# Patient Record
Sex: Male | Born: 1955 | Race: White | Hispanic: No | Marital: Married
Health system: Midwestern US, Academic
[De-identification: ages and names within clinical notes are randomized; demographics above are authoritative.]

---

## 2003-12-08 NOTE — Unmapped (Signed)
Signed by Ernesta Amble MD on 12/08/2003 at 00:00:00  Note from Referring Physician      Imported By: Dennie Fetters 01/22/2005 11:37:43    _____________________________________________________________________    External Attachment:    Please see [employer] EMR for this document.

## 2005-01-02 NOTE — Unmapped (Signed)
Signed by Forest Becker on 01/02/2005 at 17:05:42    Phone Note   Call from Patient  Call back at [facility] Phone [phone]  Caller: Patient  Call For: Surgery - Colon Rectal  Reason for Call: Talk to Nurse, Returning call to Nurse    Initial call taken by: Lolita Patella. Sherre Scarlet RMA,  January 02, 2005 4:26 PM    Follow-up for Phone Call   Returned call to patient & left message.  Follow-up for Pre-load.  Follow-up by: Forest Becker,  January 02, 2005 5:05 PM

## 2005-01-03 NOTE — Unmapped (Signed)
Signed by Forest Becker on 01/03/2005 at 10:29:31    General Preload      Preload Clinical Lists   Problems added:   FISSURE, ANAL (ICD-565.0)  DIVERTICULITIS, HX OF (ICD-V12.79)  HEMORRHOIDS (ICD-455.6)      Observations Recorded during this update  Added new observation of SOCIAL HX: He is married He is a smoker.  Packs per day- 1.  He drinks alcoholic beverages occasionally.   (01/03/2005 10:21)  Added new observation of CIGS HX: 1  (01/03/2005 10:21)  Added new observation of SMOK STATUS: smoker  (01/03/2005 10:21)  Added new observation of ALCOHOL USE: occasionally /day (01/03/2005 10:21)  Added new observation of MARITAL STAT: married  (01/03/2005 10:21)  Added new observation of SURG COMM: Removed 12 large intestines d/t diverticulitis '99  (01/03/2005 10:21)  Added new observation of PAST MED HX: Patient indicates medical history of Hx Diverticulitis, Fissure, Hemorrhoids.   (01/03/2005 10:21)  Added new observation of GICOMMENTS: Hx Diverticulitis, Fissure, Hemorrhoids  (01/03/2005 10:21)  Added new observation of NKA: T  (01/03/2005 10:21)  Past History  Past Medical History: Patient indicates medical history of Hx Diverticulitis, Fissure, Hemorrhoids.   Social History: He is married He is a smoker.  Packs per day- 1.  He drinks alcoholic beverages occasionally.                    PRIMARY CARE PHYSICIAN:   Family Physician:   Lyndel Safe, MD                 Address:   [phone]                        City:   [place]:   [place]                          Zip:   [address]                 Phone #:   [phone]                      Fax #:   [phone]  ]

## 2005-01-03 NOTE — Unmapped (Signed)
Signed by Judeen Hammans MD on 01/03/2005 at 00:00:00  Insurance Card      Imported By: Josie Dixon 01/08/2005 09:47:01    _____________________________________________________________________    External Attachment:    Please see [employer] EMR for this document.

## 2005-01-04 NOTE — Unmapped (Signed)
Signed by Judeen Hammans MD on 01/04/2005 at 00:00:00  Colorectal - Health Questionnaire      Imported By: Josie Dixon 01/22/2005 13:42:06    _____________________________________________________________________    External Attachment:    Please see [employer] EMR for this document.

## 2005-01-04 NOTE — Unmapped (Signed)
Signed by Judeen Hammans MD on 01/04/2005 at 16:44:26    Surgery New Patient Visit      Intake - Surgery   Comments New patient visit for 2nd opinion on fissure.  Patient has surgery scheduled in April with Dr. Rosita Kea.    Vital Signs   Height: 71 in.  Weight: 206.6 lbs. BMI (in-lb): 28.92     Temperature: 97.8 degrees  F (oral)  Pulse rate: 60 Respirations: 16      Blood Pressure: Standard  BP #1: 120 / 70mm Hg     Coordinating Care Providers   Primary Care Provider: Coralee North M.D.  Referring Physician: Sofie Rower M.D.  New Medication:  TAVIST ND TABS (LORATADINE TABS) qd  NITROGLYCERIN 0.2 MG/HR PT24 (NITROGLYCERIN) Apply to anus TID  * NEOMYCIN HC SUPPOSITORY 1 PER RECTUM QHS    Intake recorded by: Raynelle Highland Laug  January 04, 2005 3:09 PM      ANORECTAL     History of Present Illness   Chief Complaint: Pain in anus  History From: patient  Date of diagnosis or first concern: 10/25/2004    Basis for Diagnosis:   Clinical: other  Additional Diagnostic Details: Has loose bm's since hemicolectomy 1999.  Pain in anus for 3 mos.      Signs & Symptoms   Patient complains of: pain, bleeding, urgency  Patient denies: pruritus ani, tenesmus, drainage, flatulence, wear a pad-all/night/day, other    Incontinence:   Type: none    Treatment History:   Previous Surgeries: Emergency surgery for diverticulitis.    Medications: .2% nifedipine, metamucil  Previous Colonoscopy: Patient had prior colonoscopy performed by Dr. Thayer Jew at [facility](2000, 2001, 2003, 2005).  Removed polpys.      Past History  Past Medical History (reviewed - no changes required): Patient indicates medical history of Hx Diverticulitis, Fissure, Hemorrhoids.   Surgical History (reviewed - no changes required): Patient reports surgical history to include: Removed 12 large intestines d/t diverticulitis '99.   Family History: Father is deceased. He died of heart failure. Mother is alive. The patient indicates family history of bladder ca  mother,.   Social History (reviewed - no changes required): He is married He is a smoker.  Packs per day- 1.  He drinks alcoholic beverages occasionally.      Review of Systems   General: Denies fevers, chills, sweats, anorexia, fatigue, malaise.   Gastrointestinal: Denies nausea, vomiting, diarrhea, constipation, change in bowel habits, abdominal pain, melena, hematochezia, jaundice, spitting, encopresis, hematemesis, abdominal distention, edema, ascites.       Physical Exam   General Appearance: well-developed, well-nourished and in no acute distress    Gastrointestinal   Rectal: Everstion of anal canal with fissure posterior midline, hypertrophied skin tag.      Assessment    Problem List Updates for today's visit   1. Assessed FISSURE, ANAL (ICD-565.0) as stable  Additional Assessment  Fissure posterior midline.  Scheduled for sphincterotomy.  Would like to exhaust all non surgical options first.  Recommended botox injection.  Discussed possibility that the fissure may not heal and that surgery was definitive with highest risk profile.  BOTOX is less effective but significanlty lower risk profile.    Plan    Medications   TAVIST ND TABS (LORATADINE TABS) qd  NITROGLYCERIN 0.2 MG/HR PT24 (NITROGLYCERIN) Apply to anus TID  * NEOMYCIN HC SUPPOSITORY 1 PER RECTUM QHS    Medication List Updates for today's visit   1. Recorded  NITROGLYCERIN 0.2 MG/HR PT24 Apply to anus TID  2. Recorded * NEOMYCIN HC SUPPOSITORY 1 PER RECTUM QHS    Orders for today's visit  1. Ordered [id number] - Confirmatory consultation, Expanded [[id number]]    Instructions for today's visit  Precert for BOTOX    Additional Plan  BOTOX 50Units next week.      DISPOSITION:    Return to clinic in 1week(s)        Please send my letter and todays office note to the following:   -   Sofie Rower M.D.            ]

## 2005-01-08 NOTE — Unmapped (Signed)
Signed by Quincy Carnes on 01/08/2005 at 14:48:53    Clinical Lists Changes  Per Harriett Sine at [facility] was approved from 01/11/05 through 01/11/06   Approval number: [id number]  Call completed.  Apolinar Junes

## 2005-01-11 NOTE — Unmapped (Signed)
Signed by Judeen Hammans MD on 01/11/2005 at 15:38:52    Surgery Follow-up Visit      Intake - Surgery   Comments pt here for botox injection for anal fissure.    Vital Signs   Height: 71 in. Temperature: 98.6 degrees  F Pulse rate: 56       BP #1: 122 / 80mm Hg     Coordinating Care Providers   Primary Care Provider: Coralee North M.D.  Referring Physician: Sofie Rower M.D.    Intake recorded by: Ernesto Rutherford  January 11, 2005 3:04 PM      ANORECTAL     History of Present Illness   Chief Complaint: pt here for botox injection for anal fissure    Basis for Diagnosis:   Additional Diagnostic Details: NO SIGNIFICANT DIFFERENCE AND SEEN LAST WEEK    Signs & Symptoms   Patient complains of: pain, bleeding  Patient denies: pruritus ani, tenesmus, drainage, urgency, flatulence, wear a pad-all/night/day, other    Past History  Past Medical History (reviewed - no changes required): Patient indicates medical history of Hx Diverticulitis, Fissure, Hemorrhoids.   Surgical History (reviewed - no changes required): Patient reports surgical history to include: Removed 12 large intestines d/t diverticulitis '99.   Family History (reviewed - no changes required): Father is deceased. He died of heart failure. Mother is alive. The patient indicates family history of bladder ca mother,.   Social History (reviewed - no changes required): He is married He is a smoker.  Packs per day- 1.  He drinks alcoholic beverages occasionally.      Review of Systems   General: Denies fevers, chills, sweats, fatigue.   Gastrointestinal: Denies nausea, vomiting, diarrhea, constipation, abdominal pain, hematochezia. rectal pain      Physical Exam     Gastrointestinal   Rectal: Everstion of anal canal with fissure posterior midline, hypertrophied skin tag.      Assessment    Problem List Updates for today's visit   1. Assessed FISSURE, ANAL (ICD-565.0) as stable  Additional Assessment  no significant difference IN SYMPTOMS.  wE DISCUSSED THE  rationale for Botox therapy and the possibility that HE WOULD STILL REQUIRE SURGERY.  Therefore he consented to Botox treatment.  wITH THE PATIENT IN THE KNEE CHEST POSITION the perineum was cleansed with alcohol AND 50 UNITS OF bOTOX WERE INJECTED IN THREE equal aliquots around the FISSURE IN THE POSTERIOR MIDLINE.  The patient tolerated procedure well    Plan    Medications   TAVIST ND TABS (LORATADINE TABS) qd  NITROGLYCERIN 0.2 MG/HR PT24 (NITROGLYCERIN) Apply to anus TID  * NEOMYCIN HC SUPPOSITORY 1 PER RECTUM QHS      Orders for today's visit  1. Ordered 99211 - Ofc Vst, Est Level I [[id number]]  2. Ordered Botulinum Toxin A [J0585]    Instructions for today's visit  I've instructed the patient to call with a progress REPORT'S.  I would expect that he would began to feel some relief in than SEVERAL DAYS.  Atheists had no significant change in symptoms and to three weeks WILL SCHEDULE HIM FOR SPHINCTEROTOMY.        DISPOSITION:    Return to clinic in 6week(s)        Please send my letter and todays office note to the following:   -   Sofie Rower M.D.            ]

## 2005-01-23 NOTE — Unmapped (Signed)
Signed by Neva Seat on 01/23/2005 at 16:03:07          [facility]   [address]  [place], [place] [address]   [phone]  Fax: [phone]               January 04, 2005      Sofie Rower, M.D.   [address]  [place], [place] [address]  Ph: [phone]  Fax: [phone]        RE: [name]   DOB:  12-Apr-1956      Dear Rhoderick Moody,    It was a pleasure seeing your patient, Chris Jefferson, at your request for surgical evaluation. Enclosed please find my office note and recommendations regarding this patient.    I would like to thank you for allowing me to see this patient.  I will await further requests for any future opinions or care of this patient.    Best personal regards.    Sincerely,          Geraldo Pitter. Earlene Plater, M.D. FACS  Assistant Professor Surgery  Division of Colon and Rectal Surgery

## 2005-01-24 NOTE — Unmapped (Signed)
Signed by Neva Seat on 01/24/2005 at 15:17:20          [facility]   [address]  [place], [place] [address]   [phone]  Fax: [phone]           January 11, 2005      Sofie Rower, M.D.   [address]  [place], [place] [address]  Ph: [phone]  Fax: [phone]      RE: RAYBON CONARD   DOB:  [birth year]      Dear Rhoderick Moody,    I recently saw [facility] in the office. Enclosed please find my office note and recommendations regarding this patient.  Should you have any further questions please do not hesitate to contact me at [phone]    I would like to thank you for allowing me to see this patient.       Best personal regards.    Sincerely,          Geraldo Pitter. Earlene Plater, M.D. FACS  Assistant Professor Surgery  Division of Colon and Rectal Surgery

## 2005-01-26 NOTE — Unmapped (Signed)
Signed by Zenaida Deed Yetta Flock MA on 03/06/2005 at 11:58:34    [facility] Surgery Scheduling Form      Surgery  / Procedure Schedule Sheet   Surgeon: Judeen Hammans, M.D.  Anesthesia Type: General  Procedure: Sphincterotomy  Diagnoses: anal fissure      Patient Information   Name: Chris Jefferson  DOB: 17-Sep-1956  SSN: [id number]  Address: [address], [place]  [address]  Gender: Male  Home phone: [phone]  IDX #: [id number]  Last Word #: [id number]    Insurance Information   Form Completed By: Yvonna Alanis  Phone # [phone]  Surgery Scheduled with: Diane    Surgery Notification   Scheduled Date: 02/15/2005  Scheduled Time: 10:30am  Comments: Pt. informed.  Needs EKG per [facility] at [facility].  E[facility] scheduled for 4/12 at [facility].      Patient Insurance Information:    Primary: ANTHEM 1-[place]  ID#: [id number]    Secondary:   ID#:       Primary Care Physician Information:    PCP:

## 2005-02-09 NOTE — Unmapped (Signed)
Signed by Zenaida Deed Yetta Flock MA on 02/09/2005 at 14:46:35    Phone Note   Call from Patient  Call back at Home Phone: [phone]  Caller: Patient  Department: Surgery - Colon Rectal  Call for: davis  Reason for Call: returning call to nurse, talk to nurse    Initial call taken by: Lolita Patella. Sherre Scarlet RMA,  February 09, 2005 1:27 PM    Follow-up for Phone Call   LM for pt.  Follow-up by: Zenaida Deed. Yetta Flock, [place],  February 09, 2005 2:46 PM

## 2005-02-14 NOTE — Unmapped (Signed)
Signed by Judeen Hammans MD on 02/14/2005 at 00:00:00  Pre - Op EKG      Imported By: Josie Dixon 03/14/2005 11:12:44    _____________________________________________________________________    External Attachment:    Please see [facility] EMR for this document.

## 2005-02-15 NOTE — Unmapped (Signed)
Signed by   LinkLog[employer] on 03/09/2005 at 10:33:37  Patient: Chris Jefferson  Note: All result statuses are Final unless otherwise noted.    Tests: (1)  (MR)    Order Note:                                 [facility]     PATIENT NAME:   Chris Jefferson, Chris Jefferson                     MR #:  [id number]  DATE OF BIRTH:  [birth year]                         ACCOUNT #:  [id number]  PHYSICIAN:      Geraldo Pitter. Earlene Plater, M.D.             ROOM #:  SERVICE:        Surgery                            NURSING UNIT:  DICTATED BY:    Geraldo Pitter. Earlene Plater, M.D.             [facility]:  S  PROCEDURE DATE: 02/15/2005                         ADMIT DATE:  02/15/2005                                                     DISCHARGE DATE:                                    OPERATIVE REPORT     ATTENDING SURGEON:  Judeen Hammans, M.D.     ASSISTANT:  None.     PREOPERATIVE DIAGNOSIS:     1.  Chronic anal fissure.     POSTOPERATIVE DIAGNOSIS:     1.  Chronic anal fissure.     PROCEDURE:     1.  Closed partial lateral internal sphincterotomy.     ANESTHESIA:  General.     ESTIMATED BLOOD LOSS:  Minimal.     COMPLICATIONS:  None.     POSTOPERATIVE PROCEDURE CONDITION:  Good.     INDICATIONS:  Mr. Dery is a gentleman who I saw in the office twice with a  chronic posterior midline fissure.  This had been treated with nifedipine  ointment as well as botox.  He failed to heal.  We recommended a  sphincterotomy.  We discussed the risks of the procedure, which would include  bleeding, infection as well as alternatives, which would include continued  medical therapy.  We also discussed the small incidence of fecal incontinence  following sphincterotomy.  The patient demonstrated understanding and elected  to proceed.     DETAILS OF PROCEDURE:  The patient was brought to the operating room, placed  supine on the operating table.  After induction of general endotracheal  anesthesia, he was placed in the lithotomy position.  The perineum was then  prepped and draped  sterilely.  Intersphincteric groove was then identified.  An  11-blade knife was used first to divide the internal sphincter on the  patient's left side.  This was done in the left lateral location.  With two  fingers then used to distract the two edges of the sphincter resulting in a  palpable defect in the lateral aspect of the internal sphincter.  Of note,  the patient had a chronic fissure in the posterior midline.  Also of interest  he had some redundancy of the rectal mucosa.  He had some evidence of  internal intussusception with coughing while under anesthesia.  This was an  incidental finding.  After performing the sphincterotomy hemostasis was  obtained and the area was anesthetized with 0.5% Marcaine to provide  postoperative pain relief.  The patient was then taken to recovery after he  was extubated in the operating room where he was noted to be in good  condition.                                                           _______________________________________  BRD/dla                                _____  D:  02/15/2005 11:27                   Geraldo Pitter. Earlene Plater, M.D.  T:  02/18/2005 12:00  Job #:  [id number]                                       OPERATIVE REPORT                                        COPY                   Page    1 of 1    Note: An exclamation mark (!) indicates a result that was not dispersed into   the flowsheet.  Document Creation Date: 03/09/2005 10:33 AM  _______________________________________________________________________    (1) Order result status: Final  Collection or observation date-time: 02/15/2005 00:00  Requested date-time:   Receipt date-time:   Reported date-time:   Referring Physician:    Ordering Physician:  Reviewed In [facility])  Specimen Source:   Source: DBS  Filler Order Number: [id number] ASC  Lab site:

## 2005-02-22 NOTE — Unmapped (Addendum)
Signed by Judeen Hammans MD on 02/22/2005 at 15:49:41    Colo-Rectal Follow-up Visit      Assessment    Problem List Updates for today's visit   1. Removed FISSURE, ANAL (ICD-565.0). Reason: resolved    Plan    Medications   TAVIST ND TABS (LORATADINE TABS) qd  NITROGLYCERIN 0.2 MG/HR PT24 (NITROGLYCERIN) Apply to anus TID  * NEOMYCIN HC SUPPOSITORY 1 PER RECTUM QHS      Orders for today's visit  1. Ordered Surgical Follow-up (no charge) [[id number]]          DISPOSITION:    Return to clinic prn     THYROID DISEASE     History of Present Illness   Chief Complaint: S/P sphincterotomy    Basis for Diagnosis:   Additional Diagnostic Details: well, itches some    Past History  Past Medical History (reviewed - no changes required): Patient indicates medical history of Hx Diverticulitis, Fissure, Hemorrhoids.   Surgical History (reviewed - no changes required): Patient reports surgical history to include: Removed 12 large intestines d/t diverticulitis '99, Sphincterotomy.   Family History (reviewed - no changes required): Father is deceased. He died of heart failure. Mother is alive. The patient indicates family history of bladder ca mother,.   Social History (reviewed - no changes required): He is married He is a smoker.  Packs per day- 1.  He drinks alcoholic beverages occasionally.      Review of Systems       Intake - Surgery   Comments pt S/P sphincterotomy    Vital Signs   Height: 71 in.  Weight: 203 lbs. Weight changed since last visit: -3.60 lbs.  Temperature: 98.6 degrees  F Pulse rate: 78 Respirations: 12        BP #1: 138 / 86mm Hg     Coordinating Care Providers   Primary Care Provider: Coralee North M.D.  Referring Physician: Sofie Rower M.D.    Intake recorded by: Ernesto Rutherford  February 22, 2005 3:34 PM      Physical Exam     Gastrointestinal   Rectal: healed fissure    cc:  Coralee North M.D.  Sofie Rower M.D.              Signed by Neva Seat on 03/01/2005 at 11:47:53    Faxed to referring  Dr(s).

## 2020-06-09 ENCOUNTER — Ambulatory Visit: Admit: 2020-06-09 | Discharge: 2020-06-09 | Payer: MEDICARE

## 2020-06-09 ENCOUNTER — Inpatient Hospital Stay: Admit: 2020-06-09 | Discharge: 2020-06-13 | Payer: MEDICARE | Attending: Family

## 2020-06-09 ENCOUNTER — Inpatient Hospital Stay: Admit: 2020-06-09 | Payer: MEDICARE | Attending: Family

## 2020-06-09 DIAGNOSIS — M542 Cervicalgia: Secondary | ICD-10-CM

## 2020-06-09 DIAGNOSIS — M5136 Other intervertebral disc degeneration, lumbar region: Secondary | ICD-10-CM

## 2020-06-09 DIAGNOSIS — M545 Low back pain, unspecified: Secondary | ICD-10-CM

## 2020-06-09 NOTE — Unmapped (Signed)
I agree w the H&P as transcribed by the medical student and edited by me, have interviewed and examined the patient, and personally reviewed studies, proposing the treatment plan as outlined.    Has had chrponic LBP  Did PT in past 3y ago, helped.    Takes Tylenol for aches and pains  PRN  Progressive worsening recently  Gets sharp pain w activity  Accompanied by shooting pain down legs at times  Occ weak w PF    PE  PMH, PSH, FH, SH and 10 point ROS is as reviewed on paper-intake scanned into chart.      Gen: No acute distress  HEENT: NCAT  Neck: supple  Chest: symm expansion  Abd: Nondistended  UE: Non-irritable ROM shoulders, elbows, wrists wo pain  LE: Non-irritable ROM hips, knees, ankles wo pain  Spine:   Nl alignment  FOROM TL spine w min discomfort  Nontender  No focal SM def BLE  Nl gait      Imaging independently reviewed w pt    Imp  Back pain    Plan  I had an extensive discussion with the patient regarding the multifactorial nature of axial spinal pain with and without radicular symptoms.  We discussed various treatment options, including nonoperative strategies such as judicious medication use, physical therapy, home exercise programs, and interventional pain management.  Answered pt questions to satisfaction.     Refer to PT  Discussed nat ho condition and further imaging, tx options in future.

## 2020-06-09 NOTE — Unmapped (Signed)
Mr. Appleby presents for low back pain. He has had chronic back pain for approximately 3 years. He has had PT in the past which helped somewhat at that time but does not currently have PT. He endorses occasional exacerbation of the pain while performing daily activities, usually once per day. When the pain is the worst he will have the sensation of his legs giving out and occasional weakness of planting his foot.     On exam: no focal deficits, strong plantarflexion, good range of motion with flexion and extension    Xray imaging consistent with chronic arthritic degeneration with osteophytes and loss of disc space - most prominent at L5/S1    Patient to have PT to strengthen muscular support

## 2020-09-02 ENCOUNTER — Ambulatory Visit: Admit: 2020-09-02 | Discharge: 2020-09-02 | Payer: MEDICARE

## 2020-09-02 DIAGNOSIS — M5416 Radiculopathy, lumbar region: Secondary | ICD-10-CM

## 2020-09-02 NOTE — Unmapped (Signed)
Addended by: Magdalen Spatz B on: 09/02/2020 10:18 AM     Modules accepted: Orders

## 2020-09-02 NOTE — Unmapped (Signed)
Feels about 50% better w neck and back pain  However, app halfway through PT started having numbness halfway down leg  Wakes at night w numbness  Used to have numbness in 2nd toe B but more in thigh now.    Pins and needles  Not painful.    Had an electric shock in leg x3 in one day but not since.        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

## 2020-09-02 NOTE — Unmapped (Signed)
Pt is scheduled for mri lumbar wo 09/05/20 at 7:45 am [facility] [place]  . Pt is aware.

## 2020-09-02 NOTE — Unmapped (Signed)
Addended by: Elenora Gamma on: 09/02/2020 10:42 AM     Modules accepted: Orders

## 2020-09-05 ENCOUNTER — Encounter

## 2020-09-08 ENCOUNTER — Ambulatory Visit: Admit: 2020-09-08 | Discharge: 2020-09-08 | Payer: MEDICARE

## 2020-09-08 DIAGNOSIS — M5416 Radiculopathy, lumbar region: Secondary | ICD-10-CM

## 2020-09-08 NOTE — Unmapped (Signed)
Has pain in low back  Skips thigh and starts back up blow knee and into toes.  Sx x 3weeks  Has not noticed weakness, or change in gait  Does feel bent over when gets up from sitting or lying   Did PT and learned HEP      PE  Good transition  Good posture  Nl gait  No focal motor def      MRI w R L34 disc herniation  Has sacralized L5    Plan  COnt HEP  Discussed ESi vs surgery in future

## 2020-09-15 NOTE — Unmapped (Signed)
Faxed to  [phone]  Charlyne Petrin, DO patients mri report. And pt is also aware. PSA Level from his blood work was .0 something he said it was ok.

## 2020-09-16 ENCOUNTER — Ambulatory Visit: Payer: PRIVATE HEALTH INSURANCE

## 2020-10-20 ENCOUNTER — Ambulatory Visit: Admit: 2020-10-20 | Discharge: 2020-10-20 | Payer: MEDICARE

## 2020-10-20 DIAGNOSIS — M5416 Radiculopathy, lumbar region: Secondary | ICD-10-CM

## 2020-10-20 NOTE — Unmapped (Signed)
Overall feels better  Takes a minute to get out of car and straighten out.    Has had sharp pains at times  Seen chiropractor  Pain much better but numbness quite bothersome 4/10    PE  Mild pain w transition  Tender sl L paraspinal LS junction      Imaging independently reviewed w pt    Imp  Chronic LBP    Plan  Discussed multifactorial nature of sx.  Has had prostate eval, OK  Interested in [facility], referred.

## 2020-11-30 ENCOUNTER — Ambulatory Visit: Admit: 2020-11-30 | Discharge: 2020-11-30 | Payer: MEDICARE

## 2020-11-30 DIAGNOSIS — G894 Chronic pain syndrome: Secondary | ICD-10-CM

## 2020-11-30 MED ORDER — OMNIPAQUE (iohexol) 240 mg iodine/mL 3 mL
240 | Freq: Once | INTRAVENOUS | Status: AC
Start: 2020-11-30 — End: 2020-11-30
  Administered 2020-11-30: 21:00:00 3 mL via INTRASPINAL

## 2020-11-30 MED ORDER — sodium chloride 0.9% 2 mL
Freq: Once | INTRAMUSCULAR | Status: AC
Start: 2020-11-30 — End: 2020-11-30
  Administered 2020-11-30: 21:00:00 2 mL via INTRAMUSCULAR

## 2020-11-30 MED ORDER — triamcinolone acetonide (KENALOG-40) injection 80 mg
40 | Freq: Once | INTRAMUSCULAR | Status: AC
Start: 2020-11-30 — End: 2020-11-30
  Administered 2020-11-30: 21:00:00 80 mg via INTRAMUSCULAR

## 2020-11-30 MED ORDER — lidocaine (PF) (XYLOCAINE) 10 mg/mL (1 %) 5 mL
10 | Freq: Once | INTRAMUSCULAR | Status: AC
Start: 2020-11-30 — End: 2020-11-30
  Administered 2020-11-30: 21:00:00 5 mL via INTRAMUSCULAR

## 2020-11-30 NOTE — Unmapped (Signed)
frNo flowsheet data found.    Pre Op Procedure 11/30/2020   Pre Op Blood Pressure 136/80   Pre Op Respiration 16   Pre Op Pulse 57   Pre Op Temperature 96.4   Pre Op Pain Score 96   NRS Score Pre Procedure 3   Latex N   Betadine N   Drugs or Medications Yes   Hospitalization within last 2 weeks? No   Blood Thinner Prescribed No   Diagnosed with MRSA of Body or Spine? No   History of passing out related to any procedure/surgery? No   Been diagnosed with cancer Yes   Treated with Chemotherapy or Radiation No   Problem with Platelets No   Bleeding Problems/Blood Platelets? No   Problems with liver including hepatitis or cirrhosis? No   Currently receiving kidney dialysis treatment? No   Have you had oral steroids in the past month? No   Have you had steroid injections in the past month? No   Recent cough, cold, infections or antibiotics? No   Pacemaker? No   Defibrillator? No   Have you ever had an organ transplant? No   Diabetic? No   Previous back surgery? No   Previous neck surgery? No   Numbness in legs? Right;Yes   Tingling in Legs? Yes;Right   Weakness in Legs? Right;Yes   Increased Pain? Yes   Low Back Pain? Yes   Thigh Pain? No   Calf Pain? No     Universal Protocol 11/30/2020   Procedure LESI L5-S1   Verified Patient Name and Date of Birth Verbally   Pre-Procedure Area/Bedside Acknowledgement of Informed Consent Signed and Witnessed - Corresponds with Patient/ Parent/ Legal Guardian Verbally Stated Procedure(s), and Scheduled Procedure(s), Form that is signed and dated.   Pre-anesthesia assessment: N/A   Correct side/site(s) marked: Verified   Complete and signed procedure consent form: Verified   Blood products available: N/A   Correct diagnostic and radiology test results properly labeled: N/A   Is patient on oxygen (O2)? No   Patient Name and Date of Birth: Yes   Agreement of procedure(s) to be performed: Yes   Complete and signed procedure consent form: Yes   Correct side/site marking(s) is marked and  visible or documented on diagram: Yes   Correct patient position: Yes   Availability of correct implant: N/A   Diagnostic Imaging Studies properly labeled and displayed, verified by Patient Name/DOB and /or Physician: N/A   Preoperative antibiotic or fluids for irrigation purposes given: N/A   Safety precautions based on patient history or medication use: Yes   Organ Transplant: donor and recipient blood types verified N/A   Pre-incision time out done: Yes     Intra Op Procedure 11/30/2020   Attending Surgeon Chapman Fitch, MD   Assistant Surgeon Robin Searing, DO   Local Anesthesia: Yes   Specimen Removed No     Post Op Procedure 11/30/2020   Patient/Caregiver verbalized understanding? Yes   Complications during stay? No   Fluro time in seconds 18   Contrast Used? Omnipaque   Cc's of Contrast given: 3   Discharge By Verita Schneiders, RT   Discharge Date 11/30/2020   Discharge Time  3:31 PM   Discharge to: Home       PROCEDURE IN DETAIL:   PREOPERATIVE DIAGNOSIS:    1. Lumbar DDD    POSTOPERATIVE DIAGNOSIS: Same    PROCEDURE:  Lumbar epidural steroid injection L5-S1    PROCEDURE NOTE:  After obtaining written informed  consent patient was taken to the procedure room. Pre-procedure blood pressure and pulse were stable and recorded in patients clinic chart.     The patient was placed in the prone position. The lower back was prepped with antiseptic solution and draped in the usual sterile fashion.  The skin over the L5-S1 space was identified under fluoroscopic guidance and infiltrated with 1% lidocaine for local anesthesia via 25 gauge needle.  A 17-gauge tuohy needle was used to access the epidural space using loss of resistance to air technique. Following negative aspiration, 2 cc of the omnipaque dye was injected.  There was good spread of the dye from L3-L5 area. A mixture containing  4 ml of saline with 80 mg of kenalog was injected. There was no evidence of CSF, paresthesia or vascular spread. The needle was  removed. Skin was cleaned and band aid was applied.    Following the procedure the patient's vital signs were stable. The patient was discharged home in good condition after being given discharge instructions.    COMPLICATIONS: None        The patient was given written discharge instructions.

## 2020-11-30 NOTE — Unmapped (Signed)
Chief Complaint   Patient presents with   ??? Back Pain     right foot numbness/tingling        History of Present Illness  HPI  Chris Jefferson is a 65 y.o. male who was referred to our pain management clinic for consultation, evaluation and treatment of low back pain. He was referred to [place] by Antonietta Breach*. He has a significant past medical history including diverticulitis, fatty liver.    Worked in [employer] as a [place]     This pain started for about 10 years. Has been progressively worsening throughout the years.     Low back pain is 3/10 on VAS, at maximum is 10/10. Pain is sharp in nature. Pain is referred down the right leg and has numbness in the right foot. The pain is constant. The pain is improved by loosening up. The pain is worse with inactivity, sitting in a car, sleeping in one place for long periods of time.    He has tried therapy, topicals, message, oral medications for the pain. He has done PT for the low back pain. Last course of physical therapy was about three months ago.     He is currently taking occasional tylenol arthritis for the pain.         Past Medications:  Tylenol     Past Modalities:  TENS:      Yes  Physical Therapy within last 6 months: Yes  Chiropractor:     Yes  Massage Therapy:    Yes  Psychotherapy:    No    Patient Complains of:  Uro-fecal Incontinence:     No  Weight Gain/Loss:    No  Fever/Chills:     No  Weakness:     No    No flowsheet data found.      The following portions of the patient's history were reviewed and updated as appropriate: allergies, current medications, past family history, past medical history, past social history, past surgical history and problem list.    Histories  He has a past medical history of Hypertension and Osteoarthritis.    He has no past surgical history on file.    His family history is not on file.    He reports that he has never smoked. He has never used smokeless tobacco.    Allergies  Atorvastatin    Medications  Outpatient  Encounter Medications as of 11/30/2020   Medication Sig Dispense Refill   ??? allopurinoL (ZYLOPRIM) 300 MG tablet Take 300 mg by mouth daily.     ??? colchicine 0.6 mg tablet Take 0.6 mg by mouth daily.     ??? famotidine (PEPCID) 40 MG tablet Take 40 mg by mouth 2 times a day.     ??? fexofenadine-pseudoephedrine (ALLEGRA-D 24) 180-240 mg per 24 hr tablet Take 1 tablet by mouth daily.     ??? lisinopriL (PRINIVIL) 20 MG tablet Take 20 mg by mouth daily.     ??? metoprolol succinate (TOPROL-XL) 25 MG 24 hr tablet Take 25 mg by mouth daily.       No facility-administered encounter medications on file as of 11/30/2020.        Review of Systems   Constitutional: Negative for chills and fever.   Respiratory: Negative for cough and shortness of breath.    Gastrointestinal: Negative for abdominal pain, constipation, diarrhea, heartburn, nausea and vomiting.   Neurological: Negative for dizziness, light-headedness and headaches.   All other systems reviewed and  are negative.      Vitals  Blood pressure 136/80, pulse 57, temperature 96.4 ??F (35.8 ??C), resp. rate 16, height 5' 11 (1.803 m), weight (!) 235 lb (106.6 kg).    Physical Exam  Musculoskeletal:        Legs:         Neurologic Exam     Motor Exam     Strength   Right iliopsoas: 5/5  Left iliopsoas: 5/5  Right quadriceps: 5/5  Left quadriceps: 5/5  Right hamstring: 5/5  Left hamstring: 5/5  Right anterior tibial: 4/5  Left anterior tibial: 5/5  Right posterior tibial: 4/5  Left posterior tibial: 5/5  Right peroneal: 4/5  Left peroneal: 5/5  Right gastroc: 4/5  Left gastroc: 5/5    Sensory Exam   Right leg light touch: normal  Left leg light touch: normal     Back Exam     Muscle Strength   Right Quadriceps:  5/5   Left Quadriceps:  5/5   Right Hamstrings:  5/5   Left Hamstrings:  5/5             Activities of Daily Living:  1. Activity Level - patient indicates as the same and performs independently.  2. Sleep Patterns - patient indicates as the same and performs  independently.                                 - indicates an average of   hours of sleep per night.  3. Motivation - patient indicates as the same and performs independently.  4. Functional Level - patient indicates as the same and performs independently.           (a) Bathing - patient indicates as the same and performs independently.           (b) Dressing - patient indicates as the same and performs independently.           (c) Transferring from bed/chair - patient indicates as the same and performs independently.           (d) Walking - patient indicates as the same and performs independently.           (e) Eating - patient indicates as th esame and performs independently.           (f) Toilet Use - patient indicates as the same and performs independently.           (g) Personal Hygiene - patient indicates as the same and performs independently.  Notes:      Review of Lab Results  No results found for: WBC, RBC, HGB, HCT, MCV, MCH, MCHC, RDW, PLT, MPV, CMP, MG, BUN, INR, PTT    Pain Flowsheet (Urine Drug/OARRS-eKASPER/Narcotic) 11/30/2020   OARRS/eKASPER Status Reviewed   OARRS/eKASPER Consistent with Prescriber Expectation Y       No results found for this or any previous visit.   I have completed the required OARRS documentation for this patient on 11/30/2020.  Leron ROMANO    Chronic Assessment Tools:  PEG Total Score:    PDI Total Score:      ORT Total Score (MALE):    ORT Total Score (MALE):    ORT Risk Category (Male):    ORT Risk Category (Male):    SOAPP Total Score:    SOAPP Indication:      Investigations Reviewed:  [facility] [place]   null   [place], [place] [address]       Patient Name: Chris Jefferson   Case ID: [id number]   Patient DOB: 09/23/1956   Referring Physician: Roselind Messier, MD   Exam Date: 09/05/2020   Exam Description: MR Lumbar Spine w/o Contrast       HISTORY:  Right-sided low back pain and right lower extremity radiculopathy.  Symptoms for 6 months.     TECHNICAL  FACTORS:  Long- and short-axis fat- and water-weighted images were performed.     COMPARISON:  None.     FINDINGS:  No substantial scoliosis.     No fracture seen.  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  See labeled key images.     Conus terminates at T12-L1.     Findings at individual levels demonstrate:     L1-2 mild-to-moderate facet hypertrophy with no neural compression.     L2-3 1mm retrolisthesis.  Bilobed spondylotic disc displacement and mild-to-moderate facet and ligamentum flavum hypertrophy with mild facet capsulitis.  Minor bilateral foramen stenosis.     L3-4 mild diffuse spondylotic disc displacement.  4mm focal right lateral recess caudally-migrating disc herniation abuts descending right L4 nerve root.  Mild-to-moderate facet and ligamentum flavum hypertrophy.  No substantial foramen stenosis.     L4-5 diffuse spondylotic disc displacement asymmetric to the left with moderate facet hypertrophy.  Gentle abutment of both exiting L5 nerve roots.     L5-S1 transitional.  No neural compression.     Approximately 12mm T1 and T2 dark focus in the left sacral ala indeterminate although most likely a bone island.  Correlate with PSA in elderly male.     No hydronephrosis.  Small renal cysts.  No aortic aneurysm or retroperitoneal adenopathy.           CONCLUSION:   1. 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  See labeled key images.   2. L3-4 mild diffuse spondylotic disc displacement.  4mm focal right lateral recess disc caudally-migrating disc herniation abuts descending right L4 nerve root.  Mild-to-moderate facet and ligamentum flavum hypertrophy. No substantial foramen stenosis.   3. L4-5 diffuse spondylotic disc displacement asymmetric to the left with moderate facet hypertrophy.  Gentle abutment of both exiting L5  nerve roots.   4. An approximately 12mm T1 and T2 dark focus in the left sacral ala indeterminate although most likely a bone island. Correlate with PSA in elderly male.     Thank you for the opportunity to provide your interpretation.         Blima Ledger, MD        ASSESSMENT:  Low back pain  Radiculopathy  DDD  Chronic pain syndrome      PLAN:  1. UDS deferred.   2. We discussed with the patient that we are a multimodal pain management center where we acknowledge the limitations of (high dose) opioid monotherapy, but rather approach pain simultaneously from many modalities. We use two or more analgesic agents with different mechanisms of action. This combination allows for added analgesia and might have synergistic effect and allow for better analgesia with the use of lower doses of a given medication than if the drug were used alone. The medications may consist of the use of opioid and nonopioid pharmacologic agents. We also employ  interventional procedures like injections to further control the pain.  3. Continue with HEP and remain as active as possible  4. Patient is describing low back pain with radicular symptoms down the right leg to the foot. Discussed with patient that he would likely benefit from an LESI L5-S1 for which he is open and agreeable to proceed as planned.       RTC 4 weeks    Robin Searing, DO   Clydie Braun, [employer], [facility]  Anesthesia Chronic Pain Fellow  Department of Anesthesiology    ATTENDING NOTE  Davien Malone is a 65 y.o. male who was referred to our pain management clinic for consultation, evaluation and treatment of low back pain.    Patient has pain in the lower back with referred pain in the leg, patient has failed conservative therapy including PT and pharmacological management for more than 6 weeks and pain interferes with activities of daily living. MRI shows DDD. Discussed lumbar ESI L5-S1. Discussed the possibility of infection, bleeding, nerve damage, post dural puncture headache,  increased pain, paraplegia. Patient understands and agrees. Procedure was performed without any complications.    I have seen and examined the patient with the fellow, I agree with his assessment, reviewed his note and discussed care with him. I agree with his history, physical examination, assessment and plan as noted above.   England Greb Devin Going MBBS, MD

## 2020-11-30 NOTE — Unmapped (Signed)
PAIN MANAGEMENT PROCEDURE DISCHARGE INSTRUCTIONS      1. Keep injection /surgical site dry until the band-aid/dressing is removed. Please remove the band-aid dressing   from the injection/surgical site between the first twelve to twenty-four hours after the procedure has been performed        2. If the injection/surgical site is sore, you may apply an ice pack to that area for twenty minutes every two hours for the initial twenty four hours.  DO NOT APPLY HEAT to the affected area for 3 days.    3. Occasionally you may notice slight increase in your pain after the procedure.  This should start to improve within the next twenty-four to forty-eight hours. (RFA procedure may take 3- 4 weeks for improvement in pain)    4. Remember, it may take as long as 3-4 days before you notice a gradual improvement in your pain and/or other symptoms.     5. You may continue to take your pain medication as needed.      6. DO NOT stop taking any other medications previously prescribed.      7. Unless otherwise instructed, you may continue with your normal activities once you leave the office.       8. If you are currently going to physical therapy, continue to do so unless your doctor instructs you not to.      9. If you develop any other symptoms, such as fever, rash, unusual drainage from the site, severe headache or weakness,     Please contact our office.      10. Other: If you have been prescribed anticoagulation medication and stopped for this procedure, please follow up with your prescribing physician for directions on restarting your anticoagulation.  ______________________________________________________________________________________   _______________________________________________________________________________________      Some normal possible side effects of steroids are:   *Fluid retention                                                   *Increased blood sugar                          *Dull headache   *Increased  sweating                                           *Increased appetite                                *Mood swings   *Slight increase in blood pressure                   *Weight gain                                             *Flushing      Diabetics are recommended to have a plan with their primary care provider/endocrinologist for the increase in blood sugar.     If you have any questions or concerns please contact the office at [phone].  If it is after   business hours follow the prompts to the on-call physician.  Thank you.

## 2020-12-28 ENCOUNTER — Ambulatory Visit: Payer: MEDICARE

## 2021-01-03 ENCOUNTER — Ambulatory Visit: Admit: 2021-01-03 | Discharge: 2021-01-03 | Payer: MEDICARE

## 2021-01-03 ENCOUNTER — Inpatient Hospital Stay: Admit: 2021-01-03 | Payer: MEDICARE

## 2021-01-03 DIAGNOSIS — M7062 Trochanteric bursitis, left hip: Secondary | ICD-10-CM

## 2021-01-03 DIAGNOSIS — M160 Bilateral primary osteoarthritis of hip: Secondary | ICD-10-CM

## 2021-01-03 DIAGNOSIS — M7061 Trochanteric bursitis, right hip: Secondary | ICD-10-CM

## 2021-01-03 NOTE — Unmapped (Signed)
Chief Complaint   Patient presents with   ??? Back Pain     lower   ??? Hip Pain     bilateral        History of Present Illness  HPI  Chris Jefferson is a 65 y.o. male who was referred to our pain management clinic for consultation, evaluation and treatment of low back pain.   He has a significant past medical history including diverticulitis, fatty liver.    Low back pain is 2/10 on VAS, at maximum is 5/10. Pain is more dull now, he has had less sharp pains. Pain is referred down the right leg and has numbness in the right foot. The pain is constant. The pain is improved by loosening up. The pain is worse with inactivity, sitting in a car, sleeping in one place for long periods of time.    Bilateral hip pain is 3/10 on VAS, at maximum is 5/10. Pain is aching and sharp in nature. Pain is not referred. The pain is constant. The pain is improved by rest. The pain is worse with laying on his sides.       Past Medications:  Tylenol      Past Modalities:  TENS:                                                              Yes  Physical Therapy within last 6 months:          Yes  Chiropractor:                                                   Yes  Massage Therapy:                                          Yes  Psychotherapy:                                               No     Patient Complains of:  Uro-fecal Incontinence:                                   No  Weight Gain/Loss:                                          No  Fever/Chills:                                                    No  Weakness:  No         Injection Information 01/03/2021   Procedure Date 11/30/2020   Procedure ESI   ESI Lumbar   Lumbar L5 - S1   Effect of Injection (%): 40         The following portions of the patient's history were reviewed and updated as appropriate: allergies, current medications, past family history, past medical history, past social history, past surgical history and problem  list.    Histories  He has a past medical history of Hypertension and Osteoarthritis.    He has no past surgical history on file.    His family history is not on file.    He reports that he has never smoked. He has never used smokeless tobacco.    Allergies  Atorvastatin    Medications  Outpatient Encounter Medications as of 01/03/2021   Medication Sig Dispense Refill   ??? allopurinoL (ZYLOPRIM) 300 MG tablet Take 300 mg by mouth daily.     ??? colchicine 0.6 mg tablet Take 0.6 mg by mouth daily.     ??? famotidine (PEPCID) 40 MG tablet Take 40 mg by mouth 2 times a day.     ??? fexofenadine-pseudoephedrine (ALLEGRA-D 24) 180-240 mg per 24 hr tablet Take 1 tablet by mouth daily.     ??? lisinopriL (PRINIVIL) 20 MG tablet Take 20 mg by mouth daily.     ??? metoprolol succinate (TOPROL-XL) 25 MG 24 hr tablet Take 25 mg by mouth daily.       No facility-administered encounter medications on file as of 01/03/2021.        Review of Systems   Musculoskeletal: Positive for back pain and myalgias.   Psychiatric/Behavioral: Positive for sleep disturbance.   All other systems reviewed and are negative.      Vitals  Blood pressure 135/78, pulse 53, temperature 96.8 ??F (36 ??C), resp. rate 16, height 5' 11 (1.803 m), weight (!) 235 lb (106.6 kg), SpO2 98 %.    Physical Exam  Vitals reviewed.   HENT:      Head: Normocephalic and atraumatic.      Nose: Nose normal.   Eyes:      Extraocular Movements: Extraocular movements intact.      Conjunctiva/sclera: Conjunctivae normal.   Pulmonary:      Effort: Pulmonary effort is normal.   Abdominal:      General: Abdomen is flat.   Musculoskeletal:      Cervical back: Normal range of motion.        Legs:       Comments: Low back pain, radiculopathy to right foot. SLR positive, improved since last time.    Bilateral hip bursa tenderness and pain.    Skin:     General: Skin is warm and dry.   Neurological:      General: No focal deficit present.      Mental Status: He is alert and oriented to person,  place, and time.   Psychiatric:         Mood and Affect: Mood normal.         Behavior: Behavior normal.        Neurologic Exam     Mental Status   Oriented to person, place, and time.      Ortho Exam    Activities of Daily Living:  1. Activity Level - patient indicates as improved and performs independently.  2. Sleep Patterns - patient indicates as improved and performs independently.                                 -  indicates an average of 7 hours of sleep per night.  3. Motivation - patient indicates as the same and performs independently.  4. Functional Level - patient indicates as the same and performs independently.           (a) Bathing - patient indicates as the same and performs independently.           (b) Dressing - patient indicates as the same and performs independently.           (c) Transferring from bed/chair - patient indicates as the same and performs independently.           (d) Walking - patient indicates as the same and performs independently.           (e) Eating - patient indicates as th esame and performs independently.           (f) Toilet Use - patient indicates as the same and performs independently.           (g) Personal Hygiene - patient indicates as the same and performs independently.  Notes:      Review of Lab Results  No results found for: WBC, RBC, HGB, HCT, MCV, MCH, MCHC, RDW, PLT, MPV, CMP, MG, BUN, INR, PTT    Pain Flowsheet (Urine Drug/OARRS-eKASPER/Narcotic) 11/30/2020 01/03/2021   OARRS/eKASPER Status Reviewed Reviewed   OARRS/eKASPER Consistent with Prescriber Expectation Y Y       No results found for this or any previous visit.   I have completed the required OARRS documentation for this patient on 01/03/2021.  DESIMIR MIJATOVIC    Chronic Assessment Tools:  PEG Total Score: (P) 7  PDI Total Score:   (P) 4  ORT Total Score (MALE): (P) 3  ORT Total Score (MALE):    ORT Risk Category (Male): (P) Low  ORT Risk Category (Male):    SOAPP Total Score: (P) 3  SOAPP Indication:  (P) Negative    Investigations Reviewed:   Patient Name: Chris Jefferson   Case ID: [id number]   Patient DOB: [id number]   Referring Physician: Roselind Messier, MD   Exam Date: 09/05/2020   Exam Description: MR Lumbar Spine w/o Contrast     HISTORY:  Right-sided low back pain and right lower extremity radiculopathy.  Symptoms for 6 months.   TECHNICAL FACTORS:  Long- and short-axis fat- and water-weighted images were performed.   COMPARISON:  None.   FINDINGS:  No substantial scoliosis.   No fracture seen.  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  See labeled key images.   Conus terminates at T12-L1.   Findings at individual levels demonstrate:   L1-2 mild-to-moderate facet hypertrophy with no neural compression.   L2-3 1mm retrolisthesis.  Bilobed spondylotic disc displacement and mild-to-moderate facet and ligamentum flavum hypertrophy with mild facet capsulitis.  Minor bilateral foramen stenosis.   L3-4 mild diffuse spondylotic disc displacement.  4mm focal right lateral recess caudally-migrating disc herniation abuts descending right L4 nerve root.  Mild-to-moderate facet and ligamentum flavum hypertrophy.  No substantial foramen stenosis.   L4-5 diffuse spondylotic disc displacement asymmetric to the left with moderate facet hypertrophy.  Gentle abutment of both exiting L5 nerve roots.   L5-S1 transitional.  No neural compression.   Approximately 12mm T1 and T2 dark focus in the left sacral ala indeterminate although most likely a bone island.  Correlate with PSA in elderly male.  No hydronephrosis.  Small renal cysts.  No aortic aneurysm or retroperitoneal adenopathy.     CONCLUSION:   1. 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  See labeled key images.   2. L3-4 mild diffuse spondylotic disc displacement.  4mm focal right  lateral recess disc caudally-migrating disc herniation abuts descending right L4 nerve root.  Mild-to-moderate facet and ligamentum flavum hypertrophy. No substantial foramen stenosis.   3. L4-5 diffuse spondylotic disc displacement asymmetric to the left with moderate facet hypertrophy.  Gentle abutment of both exiting L5 nerve roots.   4. An approximately 12mm T1 and T2 dark focus in the left sacral ala indeterminate although most likely a bone island. Correlate with PSA in elderly male.   Thank you for the opportunity to provide your interpretation.   Blima Ledger, MD      ASSESSMENT:  Low back pain  Radiculopathy  DDD  Chronic pain syndrome    PLAN:  1. UDS deferred.   2. We discussed with the patient that we are a multimodal pain management center where we acknowledge the limitations of (high dose) opioid monotherapy, but rather approach pain simultaneously from many modalities. We use two or more analgesic agents with different mechanisms of action. This combination allows for added analgesia and might have synergistic effect and allow for better analgesia with the use of lower doses of a given medication than if the drug were used alone. The medications may consist of the use of opioid and nonopioid pharmacologic agents. We also employ interventional procedures like injections to further control the pain.  3. Continue with HEP and remain as active as possible  4. He did well with L5-S1 ESI with good improvement. Can consider repeating this in the future.  5. He is reporting bilateral hip and hip bursa pain and tenderness. Differential includes hip joint pain vs trochanteric bursitis. Discussed with the patient regarding the etiology of their pain. Will obtain XR of both hips today.  Informed them that they would likely benefit from either bilateral hip intraarticular or trochanteric bursa injections. Will see results and xray, decide and have this procedure applied for and scheduled when  approved.    Follow up for injections and 4 weeks after.     Desimir Mijatovic, MD  PGY-6 Fellow  Pain Management     ATTENDING NOTE  Patient has pain in the hip area, bilateral trochanteric bursa tenderness present. Discussed bilateral  trochanteric bursa injection. Discussed the possibility of infection, bleeding, nerve damage, headache, increased pain, paraplegia. Patient understands and agrees.     Xray Hip as patient has increased pain in the hip area, rule out arthritis.     Orders Placed This Encounter   Procedures   ??? X-ray Hip Bilat incl Pelvis min 5-vws     Standing Status:   Future     Number of Occurrences:   1     Standing Expiration Date:   07/18/2021     Scheduling Instructions:            Order Specific Question:   Specific Views/Positions     Answer:   Allow Radiology to Protocol     I have seen and examined the patient with the fellow, I agree with his assessment, reviewed his note and discussed care with him. I agree with his history, physical examination, assessment and plan as noted above.   Sholom Dulude Devin Going MBBS, MD

## 2021-01-04 NOTE — Unmapped (Addendum)
-----   Message from Chapman Fitch, MD sent at 01/03/2021 10:48 AM EST -----  Regarding: mdcr  Please schedule ([id number]) bilateral trochanteric bursa injection- Dx (M70.61, M70.62) Trochanteric Bursitis      Thanks                Medicare A/B No PA required            Schedule at [facility]

## 2021-01-05 NOTE — Unmapped (Signed)
Called patient on 01/05/2021 at 3:20 PM.    Discussed XR findings of mild hip arthritis with plan to proceed with bursa injections.    Demaris Bousquet, MD  PGY-6 Fellow  Pain Management

## 2021-01-05 NOTE — Unmapped (Signed)
(  [phone]) bilateral trochanteric bursa injection- Dx (M70.61, M70.62) Trochanteric Bursitis scheduled. Patient would like a call  to go over the results of his xray.

## 2021-01-19 DIAGNOSIS — S90111A Contusion of right great toe without damage to nail, initial encounter: Secondary | ICD-10-CM

## 2021-01-19 NOTE — Unmapped (Signed)
ED Attending Attestation Note    Date of service:  01/20/2021    This patient was seen by the advanced practice provider.  I have seen and examined the patient, agree with the workup, evaluation, management and diagnosis.  The care plan has been discussed and I concur.      My assessment reveals a 65 y.o. male presenting with right great toe pain.  He reports he stubbed it while playing kickball.  The pain was so bad he was unable to sleep.    On examination, the patient is resting comfortably in bed.  He does have some minor redness of his distal right great toe.  He is able to move all digits of his right foot.  He has no tenderness palpation over his dorsal foot or ankle.  He has sensation intact light touch in all nerve distributions of his right foot.  He is 2+ DP and PT pulses on his right foot.

## 2021-01-19 NOTE — Unmapped (Addendum)
Pt was playing kickball with his grandson, fell hurt R foot and thinks it may be broken.  Pt also scraped up R arm.

## 2021-01-19 NOTE — Unmapped (Signed)
[facility] ED Note    Date of Service: 01/20/2021    Reason for Visit: Foot Injury      Patient History     HPI:  This is a 65 y.o. male with PMH of HTN, OA presenting with foot injury.  Patient states that he was playing kickball earlier today when he stubbed his right great toe and fell onto his right side.  He states that he scraped his right forearm and his nose in the process.  He denies head injury or loss of consciousness.  He reports significant pain in his right great toe and difficulty lacing weight on it.  He denies any headache, dizziness, vision changes, nausea or vomiting, neck or back pain, chest pain or abdominal pain, shortness of breath, numbness or weakness.  He is unsure of his last tetanus vaccination.    With the exception of the above, there are no aggravating or alleviating factors.    Past Medical History:   Diagnosis Date   ??? Hypertension    ??? Osteoarthritis        History reviewed. No pertinent surgical history.    Maryan Puls  reports that he has never smoked. He has never used smokeless tobacco. No history on file for alcohol use and drug use.    Current Discharge Medication List      CONTINUE these medications which have NOT CHANGED    Details   allopurinoL (ZYLOPRIM) 300 MG tablet Take 300 mg by mouth daily.      colchicine 0.6 mg tablet Take 0.6 mg by mouth daily.      famotidine (PEPCID) 40 MG tablet Take 40 mg by mouth 2 times a day.      fexofenadine-pseudoephedrine (ALLEGRA-D 24) 180-240 mg per 24 hr tablet Take 1 tablet by mouth daily.      lisinopriL (PRINIVIL) 20 MG tablet Take 20 mg by mouth daily.      metoprolol succinate (TOPROL-XL) 25 MG 24 hr tablet Take 25 mg by mouth daily.             Allergies:   Allergies as of 01/19/2021 - Fully Reviewed 01/19/2021   Allergen Reaction Noted   ??? Atorvastatin  02/01/2020       PMH: Nursing notes reviewed   PSH: Nursing notes reviewed   FH: Nursing notes reviewed   MEDS: Nursing notes  and chart reviewed         Review of Systems     Review of Systems   Constitutional: Negative for chills and fever.   HENT: Negative for congestion.    Eyes: Negative for redness.   Respiratory: Negative for shortness of breath.    Cardiovascular: Negative for chest pain.   Gastrointestinal: Negative for abdominal pain, diarrhea, nausea and vomiting.   Genitourinary: Negative for dysuria.   Musculoskeletal: Positive for joint pain (right great toe). Negative for back pain and neck pain.   Skin:        +abrasions   Neurological: Negative for dizziness, sensory change and focal weakness.   Psychiatric/Behavioral: The patient is not nervous/anxious.        Physical Exam     Physical Exam  Vitals and nursing note reviewed.   Constitutional:       General: He is not in acute distress.  HENT:      Head: Normocephalic and atraumatic. No raccoon eyes or Battle's sign.      Nose: No nasal deformity.      Right Nostril:  No epistaxis or septal hematoma.      Left Nostril: No epistaxis or septal hematoma.      Comments: Abrasion on nose     Mouth/Throat:      Mouth: Mucous membranes are moist.      Pharynx: Oropharynx is clear.   Eyes:      Extraocular Movements: Extraocular movements intact.      Conjunctiva/sclera: Conjunctivae normal.   Cardiovascular:      Rate and Rhythm: Normal rate and regular rhythm.   Pulmonary:      Effort: Pulmonary effort is normal. No respiratory distress.      Breath sounds: Normal breath sounds. No wheezing, rhonchi or rales.   Abdominal:      General: Bowel sounds are normal. There is no distension.      Palpations: Abdomen is soft.      Tenderness: There is no abdominal tenderness. There is no guarding or rebound.   Musculoskeletal:         General: No deformity.      Right forearm: No swelling, deformity or bony tenderness.      Cervical back: Neck supple.      Comments: No bony tenderness, deformity, or swelling noted to the right forearm or wrist.  2+ radial pulse.  Full range of motion of  the right elbow, wrist and hand.  5 out of 5 strength to hand grasp.  Sensation intact in all nerve distributions.  Tenderness to palpation of the right great toe and the first metatarsal.  Mild associated swelling.  No ecchymosis.  No obvious deformity.  Palpable DP and PT pulses.  Sensation intact to light touch.   Skin:     General: Skin is warm and dry.      Comments: Abrasions to right forearm   Neurological:      Mental Status: He is alert and oriented to person, place, and time.      Cranial Nerves: Cranial nerves are intact.      Sensory: Sensation is intact.      Motor: Motor function is intact.      Coordination: Coordination is intact. Finger-Nose-Finger Test normal.   Psychiatric:         Mood and Affect: Mood normal.         Behavior: Behavior normal.         Diagnostic Studies     Labs: The attending and I have reviewed laboratory findings.  Labs were reviewed and and significant values identified.    Please see electronic medical record for any tests performed in the ED     Labs Reviewed - No data to display    IMAGING STUDIES / RADIOLOGY: The attending and I have reviewed radiographic imaging.  Imaging studies were reviewed and and significant values identified.    Please see electronic medical record for any tests performed in the ED    X-ray Foot Right min 3-views   Final Result   IMPRESSION:      1.  No acute osseous abnormality.   2.  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      Approved by Larose Hires, DO on 01/20/2021 3:07 AM EDT      I have personally reviewed the images and I agree with this report.      Report Verified by: Melodye Ped, MD at 01/20/2021 3:07 AM EDT          Emergency Department Procedures  ED Course and MDM     Vital signs, medical history, social history, allergies and nursing notes reviewed.    Vitals:  BP 141/80    Pulse 58    Temp 98.7 ??F (37.1 ??C) (Oral)    Resp 18    Ht 5' 11 (1.803 m)    Wt (!) 235 lb (106.6 kg)    SpO2 98%    BMI 32.78 kg/m??     Briefly  this is a 65 y.o. male who presents to the emergency department with right great toe pain after a fall.  Patient presented afebrile and hypertensive.  Patient was in no acute distress, nontoxic and non-hypoxic.  Physical exam was remarkable for alert and oriented x4, no focal neurologic deficit, no C-spine tenderness.  Abrasions noted to the right forearm without bony tenderness, swelling or deformity.  Extremity neurovascularly intact.  Patient has tenderness on palpation of the right great toe with mild associated swelling.  No ecchymosis.  Sensation intact light touch.  Distal pulses palpable.  Please see HPI and physical for further details.    Patient's tetanus was updated.  He was given 800 mg of ibuprofen for pain.  Abrasions to the right forearm were cleansed.  Polysporin and dressing applied.  X-ray obtained of the right foot revealed no acute fracture.  X-ray did reveal severe first MTP and TMT joint osteoarthritis, which was communicated with the patient.  He was given cast shoe, RICE instructions, wound care instructions for abrasions and podiatry follow up information.    At this time the patient has been deemed safe for discharge. My customary discharge instructions including strict return precautions for worsening or new symptoms have been communicated.  Please see AVS for further details.      The patient was seen and evaluated by the attending physician Dr. Sinda Du, MD who agreed with the assessment and plan.  The patient and / or the family were informed of the results of any tests, a time was given to answer questions, a plan was proposed and they agreed with plan.     Consults:    N/A    Medications administered in the ED:    Medications   diphth,pertus(acell),tetanus (BOOSTRIX TDAP) 2.5-8-5 Lf-mcg-Lf/0.5mL syringe Syrg 0.5 mL (0.5 mLs Intramuscular Given 01/20/21 0242)   ibuprofen (MOTRIN) tablet 800 mg (800 mg Oral Given 01/20/21 0242)   bacitracin-polymyxin b (POLYSPORIN) ointment  (packets) (1 packet Topical Given 01/20/21 0249)       Impression     1. Contusion of right great toe without damage to nail, initial encounter         Plan     Discharge.         Hipolito Bayley, [place]  01/20/21 [phone]

## 2021-01-20 ENCOUNTER — Emergency Department: Admit: 2021-01-20

## 2021-01-20 ENCOUNTER — Inpatient Hospital Stay: Admit: 2021-01-20 | Discharge: 2021-01-20 | Disposition: A | Discharge status: Home / Self Care | Payer: MEDICARE

## 2021-01-20 MED ORDER — bacitracin-polymyxin b (POLYSPORIN) ointment (packets)
500-10000 | Freq: Once | TOPICAL | Status: AC
Start: 2021-01-20 — End: 2021-01-20
  Administered 2021-01-20: 07:00:00 1 via TOPICAL

## 2021-01-20 MED ORDER — ibuprofen (MOTRIN) tablet 800 mg
800 | Freq: Once | ORAL | Status: AC
Start: 2021-01-20 — End: 2021-01-20
  Administered 2021-01-20: 07:00:00 800 mg via ORAL

## 2021-01-20 MED ORDER — diphthpertusacelltetanusBOOSTRIXTDAP2585LfmcgLf05mLsyringeSyrg05mL
2.5-8-5 | Freq: Once | INTRAMUSCULAR | Status: AC
Start: 2021-01-20 — End: 2021-01-20
  Administered 2021-01-20: 07:00:00 0.5 mL via INTRAMUSCULAR

## 2021-01-20 MED FILL — BOOSTRIX TDAP 2.5 LF UNIT-8 MCG-5 LF/0.5 ML INTRAMUSCULAR SYRINGE: 2.5-8-5 2.5-8-5 Lf-mcg-Lf/0.5mL | INTRAMUSCULAR | Qty: 0.5

## 2021-01-20 MED FILL — POLYSPORIN(BACITRACIN BASE) 500 UNIT-10,000 UNIT/GRAM TOPICAL PACKET: 500-10000 500-10,000 unit/gram | TOPICAL | Qty: 1

## 2021-01-20 MED FILL — IBUPROFEN 800 MG TABLET: 800 800 MG | ORAL | Qty: 1

## 2021-01-20 NOTE — Unmapped (Signed)
Keep abrasions on right forearm clean and dry.  May apply antibiotic ointment and dressing once daily.  May take Tylenol or ibuprofen for right great toe pain.  Ice, elevate.    Follow-up with your primary care provider.    Return to the emergency department with new or worsening symptoms.

## 2021-02-08 ENCOUNTER — Ambulatory Visit: Admit: 2021-02-08 | Discharge: 2021-02-08 | Payer: MEDICARE

## 2021-02-08 DIAGNOSIS — M7061 Trochanteric bursitis, right hip: Secondary | ICD-10-CM

## 2021-02-08 MED ORDER — triamcinolone acetonide (KENALOG-40) injection 80 mg
40 | Freq: Once | INTRAMUSCULAR | Status: AC
Start: 2021-02-08 — End: 2021-02-08
  Administered 2021-02-08: 13:00:00 80 mg

## 2021-02-08 MED ORDER — bupivacaine (PF)(SENSORCAINE) 0.25% injection
0.25 | Freq: Once | INTRAMUSCULAR | Status: AC
Start: 2021-02-08 — End: 2021-02-08
  Administered 2021-02-08: 13:00:00 25 mL

## 2021-02-08 NOTE — Unmapped (Signed)
H and P reviewed from previous visit and no changes to patient's clinical presentation. Will proceed with procedure as planned.

## 2021-02-08 NOTE — Unmapped (Signed)
Procedure Performed: Trochanteric Steroid Injection  Side: Bilateral    Preoperative Diagnosis:  Trochanteric Bursitis    Postoperative Diagnosis:  same    Attending Surgeon:  Chapman Fitch, MD    Assistant Surgeon:      Anesthesia:  Local    Specimen Removed:  None    Complications:  None    Pre-Procedure Vitals:  Vitals:    02/08/21 0812   BP: 133/78   Pulse: 51   Resp: 16   Temp: 96.7 ??F (35.9 ??C)   SpO2: 97%       Procedure in Detail:.   The patient's chart was reviewed.  After obtaining written informed consent, the patient was placed in the  right decubitus position.  The left hip area was prepped and draped in the usual sterile fashion using antiseptic solution.  Then, a 22 gauge 3 1/2 inch spinal needle was used to introduce into the skin until contact was made with the bone at the left trochanter.  The needle was pulled a few mm and then advanced until we were able to reproduce the pain in the hip area.  Then, a 5 ml mixture of 0.25% marcaine with 40 mg of kenalog was injected slowly.    Then, the patient was rotated to the left decubitus position and the right side was injected using the same technique.    The patient tolerated the procedure well.  The needle was flushed and withdrawn, and a Band-Aid was applied.    Post-Procedure Vitals:  Post-Procedure Pulse:  48  Post-Procedure BP:  138/88    Disposition:  The patient was given written discharge instructions.

## 2021-02-22 NOTE — Unmapped (Signed)
Chris Jefferson is a 65 y.o. male who underwent a Trochanteric Steroid Injection for pain control.   Patient called today as a courtesy to follow up on nerve block and pain control.    Patient Reports:  Weakness    No  Numbness    No  Signs of Infection at Block Site No  Pain Score    1/10 on VAS  Complications from Nerve Block No  Percent of Relief from injection 80  Length of time relief lasted  Still working    Future Follow up Needed  No

## 2023-01-24 ENCOUNTER — Ambulatory Visit: Admit: 2023-01-24 | Discharge: 2023-01-24 | Payer: MEDICARE | Attending: Family

## 2023-01-24 ENCOUNTER — Inpatient Hospital Stay: Admit: 2023-01-24 | Discharge: 2023-03-08 | Payer: MEDICARE | Attending: Family

## 2023-01-24 DIAGNOSIS — M545 Low back pain, unspecified: Secondary | ICD-10-CM

## 2023-01-24 NOTE — Progress Notes (Signed)
Chief Complaint: cervical spine pain       Subjective:   Chris Jefferson is a 68 y.o.    Cervical spine pain, feels like gravel and gears  Has to turn his head slowly or has pain  Tries to stretch head and work on ROM  No pain radiating down arms.  No numbness or tingling  Reports headaches, started about 3 months ago. Happens about 3 times a week and will last 24 hours  Reports every now and then will have migraines.  Reports headaches are more on the mid right head.  Avoids meds but has been taking more tylenol when he has a headaches. Reports tylenol helps after about 1.5 hours.  Has not seen his PCP regarding headaches.   Feels like headaches are related to his neck.  Has not had any injections in his neck but has had some lumbar injections years ago with some relief.     ROS: All others negative except for as stated in HPI    The following portions of the patient's history were reviewed and updated as appropriate: allergies, current medications, past medical history, past surgical history, and past social history.    Objective:  Vitals:    01/24/23 0855   Weight: (!) 235 lb (106.6 kg)       Physical Exam:  Constitutional: This is a pleasant male, alert, and in no acute distress who appears stated age.  Psychiatric:   The patient's mood is normal and appropriate for their visit   Vascular exam:   Extremities warm and well perfused, no significant edema  Respiratory exam: Breathing easy and unlabored  Neuro exam:   No focal neuro deficits, Sensation is normal  Lymphatic:  No obvious lymphadenopathy  Skin:     Warm, dry  MSK:    Slow ROM of cervical, 5/5 strength BUE, negative Spurling's     Imaging: Xrays were independently reviewed and show: multilevel degeneration of cervical spine      Assessment:  Chris Jefferson is a 67 y.o. male is here today for evaluation of cervical spine pain and headaches     Plan:  discussed spine health and activity  encouraged safe ambulation  OTC medication as needed and able  MRI head  due to new onset R sided headaches  MRI C spine   Follow up after MRI is completed.        Answered all of the patient's questions to his satisfaction and understanding and he is in agreement with the plan.     Chris Jefferson MARGARET Shadrack Brummitt

## 2023-02-21 ENCOUNTER — Inpatient Hospital Stay: Admit: 2023-02-21 | Discharge: 2023-04-09 | Payer: MEDICARE | Attending: Family

## 2023-02-21 ENCOUNTER — Inpatient Hospital Stay: Admit: 2023-02-21 | Payer: MEDICARE | Attending: Family

## 2023-02-21 DIAGNOSIS — R519 Headache, unspecified: Secondary | ICD-10-CM

## 2023-02-21 DIAGNOSIS — M5030 Other cervical disc degeneration, unspecified cervical region: Secondary | ICD-10-CM

## 2023-03-20 NOTE — Telephone Encounter (Signed)
Tried to call patient to review MRI imaging per patient request but he did not answer. Left him a message to call back to go over results.

## 2023-03-22 ENCOUNTER — Ambulatory Visit: Admit: 2023-03-22 | Payer: MEDICARE | Attending: Family

## 2023-03-22 DIAGNOSIS — M4802 Spinal stenosis, cervical region: Secondary | ICD-10-CM

## 2023-03-22 NOTE — Progress Notes (Signed)
Called patient, no answer 

## 2023-03-22 NOTE — Progress Notes (Signed)
This was a Phone conversation, in lieu of an in-person visit. The patient provided verbal consent to participate in the telehealth visit.   I spent 6 minutes speaking with the patient, conducting an interview, performing a limited exam, and educating the patient on my assessment and plan. I also spent 10 minutes, on the same day as the encounter, preparing to see the patient (eg, review of tests), obtaining and/or reviewing separately obtained history, ordering medications, tests, or procedures, documenting clinical information in the electronic or other health record, independently interpreting results and communicating results to the patient/family/caregiver, providing care coordination , and performing non-face-to-face activities.    FU cervical spine MRI.  Reports neck is similar, sometimes he has to think mentally to turn head left or right  Some improvement in HA  Doing more exercises regarding his neck  No pain, numbness, or tingling radiating down his BUE  Takes tylenol when needed.    PE:   alert and orientated x3, well appearing, NAD   Reports no numbness or tingling  Reports no changes in strength   No edema.     Imaging:  Brain MRI  1. Mild nonspecific white matter hyperintensity, likely related to small vessel disease.   2.  No intracranial mass or hemorrhage.     Cervical spine MRI  1.  Moderate multilevel cervical degenerative disc disease most significant at C5-6 where left-sided uncinate hypertrophy contributes to at least least moderate to severe left neuroforaminal stenosis     Assessment: cervical DDD    Plan  Pain clinic referral for injections  Discussed continuing PT exercises  OTC medication as needed and able  Discussed spine health and activity

## 2023-03-22 NOTE — Progress Notes (Signed)
Called patient for phone visit, no answer.

## 2023-05-13 NOTE — Progress Notes (Signed)
Subjective:      Patient ID: Chris Jefferson is a 67 y.o. male.  Chief Complaint   Patient presents with    Knee Pain     Left knee pain, giving out over the past few months       HPI    Patient is a 67 y.o. male who presents for follow up office visit regarding left knee pain with nki. Pt said it started a few months ago and gives out randomly. Exam of his left knee showed he has no effusion, some medial joint line tenderness, valgus stretch shows a good endpoint, negative McMurray's sign, and a negative drawer test. Ordered an x-ray of his left knee that was completely unremarkable, showed minimal signs of arthritis consistent with his age. Explained his pain is most likely because of a cartilage tear. Discussed treatment options, gave him a cortisone injection.     Procedure:  Cortisone injection in left knee with a lateral approach. After verbal consent, site was cleaned with alcohol+iodine and injected with 1 cc lidocaine and 2 cc triamcinolone. Tolerated well. No blood loss. Wound care given. Signs of infection, bleeding, nerve damage, tendon damage discussed.     Review of Systems   Musculoskeletal:  Positive for arthralgias (left knee).   All other systems reviewed and are negative.      Objective:   Physical Exam  Vitals and nursing note reviewed.   Constitutional:       General: He is not in acute distress.     Appearance: Normal appearance.   Musculoskeletal:      Left knee: No effusion or bony tenderness. Normal range of motion. Tenderness present over the medial joint line.      Instability Tests: Anterior drawer test negative. Posterior drawer test negative. Medial McMurray test negative and lateral McMurray test negative.   Skin:     General: Skin is warm and dry.   Neurological:      Mental Status: He is alert and oriented to person, place, and time.   Psychiatric:         Mood and Affect: Mood normal.          No visits with results within 6 Month(s) from this visit.   Latest known visit with results  is:   Telephone on 07/31/2022   Component Date Value Ref Range Status    Cholesterol, Total 11/01/2022 162  100 - 199 mg/dL Final    Triglycerides 11/01/2022 185 (H)  0 - 149 mg/dL Final    HDL Cholesterol 11/01/2022 37 (L)  >39 mg/dL Final    VLDL Cholesterol Cal 11/01/2022 32  5 - 40 mg/dL Final    LDL CHOL CALC (NIH) 11/01/2022 93  0 - 99 mg/dL Final    LDl/HDL Ratio 11/01/2022 2.5  0.0 - 3.6 ratio Final    TSH 11/01/2022 2.150  0.450 - 4.500 uIU/mL Final    T4,Free(Direct) 11/01/2022 0.90  0.82 - 1.77 ng/dL Final    Glucose 16/08/9603 127 (H)  70 - 99 mg/dL Final    BUN 54/07/8118 16  8 - 27 mg/dL Final    Creatinine, Ser 11/01/2022 1.33 (H)  0.76 - 1.27 mg/dL Final    eGFR CKD-EPI 1478GN 11/01/2022 59 (L)  >59 mL/min/1.73 Final    BUN/Creatinine Ratio 11/01/2022 12  10 - 24 Final    Sodium 11/01/2022 143  134 - 144 mmol/L Final    Potassium 11/01/2022 4.3  3.5 - 5.2 mmol/L Final  Chloride 11/01/2022 107 (H)  96 - 106 mmol/L Final    CARBON DIOXIDE 11/01/2022 21  20 - 29 mmol/L Final    Calcium 11/01/2022 9.5  8.6 - 10.2 mg/dL Final    Protein, Total, Serum 11/01/2022 6.3  6.0 - 8.5 g/dL Final    Albumin 47/82/9562 4.4  3.9 - 4.9 g/dL Final    Globulin, Total 11/01/2022 1.9  1.5 - 4.5 g/dL Final    Albumin/Globulin Ratio 11/01/2022 2.3 (H)  1.2 - 2.2 Final    Bilirubin, Total 11/01/2022 0.8  0.0 - 1.2 mg/dL Final    Alkaline Phosphatase, S 11/01/2022 81  44 - 121 IU/L Final    AST 11/01/2022 44 (H)  0 - 40 IU/L Final    ALT (SGPT) 11/01/2022 76 (H)  0 - 44 IU/L Final    WBC 11/01/2022 5.5  3.4 - 10.8 x10E3/uL Final    RBC 11/01/2022 4.51  4.14 - 5.80 x10E6/uL Final    HGB 11/01/2022 14.1  13.0 - 17.7 g/dL Final    HCT 13/06/6577 42.4  37.5 - 51.0 % Final    MCV 11/01/2022 94  79 - 97 fL Final    MCH 11/01/2022 31.3  26.6 - 33.0 pg Final    MCHC 11/01/2022 33.3  31.5 - 35.7 g/dL Final    RDW 46/96/2952 12.8  11.6 - 15.4 % Final    Platelet count 11/01/2022 212  150 - 450  x10E3/uL Final    Neutrophils % 11/01/2022 49  Not Estab. % Final    Lymphocytes % 11/01/2022 38  Not Estab. % Final    Monocytes % 11/01/2022 9  Not Estab. % Final    Eosinophils % 11/01/2022 3  Not Estab. % Final    Basophils % 11/01/2022 1  Not Estab. % Final    Neutrophils Absolute 11/01/2022 2.7  1.4 - 7.0 x10E3/uL Final    Lymphocytes Absolute 11/01/2022 2.1  0.7 - 3.1 x10E3/uL Final    Monocytes Absolute 11/01/2022 0.5  0.1 - 0.9 x10E3/uL Final    Eosinophils Absolute 11/01/2022 0.1  0.0 - 0.4 x10E3/uL Final    Basophils Absolute 11/01/2022 0.1  0.0 - 0.2 x10E3/uL Final    Immature Grans % ([facility]) 11/01/2022 0  Not Estab. % Final    Immature Grans Absolute ([facility]) 11/01/2022 0.0  0.0 - 0.1 x10E3/uL Final    PSA 11/01/2022 <0.1  0.0 - 4.0 ng/mL Final    Testosterone, Serum 11/01/2022 287  264 - 916 ng/dL Final    Testosterone, Free(Direct) 11/01/2022 4.5 (L)  6.6 - 18.1 pg/mL Final       BP 116/64   Pulse 61   Ht 5' 11 (1.803 m)   Wt 241 lb (109.3 kg)   SpO2 97%   BMI 33.61 kg/m     Assessment and Plan:       ICD-10-CM    1. Chronic pain of left knee  M25.562 XR-KNEE LEFT 3 VIEWS    G89.29 triamcinolone acetonide (KENALOG) injection 80 mg     DISCONTINUED: triamcinolone acetonide (KENALOG) 80 mg/mL 80 mg, lidocaine 10 mg/mL (1 %) 1 mL intra-articular injection          [name] understands and agrees with above treatment plan.             By signing my name below, I, Silvestre Gunner, attest that this documentation has been prepared under the direction of Joni Reining, DO.  Electronically signed: Silvestre Gunner, 4:08 PM, 05/13/2023

## 2023-05-14 ENCOUNTER — Ambulatory Visit: Admit: 2023-05-14 | Discharge: 2023-05-14 | Payer: MEDICARE

## 2023-05-14 DIAGNOSIS — M5412 Radiculopathy, cervical region: Secondary | ICD-10-CM

## 2023-05-14 NOTE — Procedures (Signed)
Pt came in for CESI but had a knee injection yesterday. Pt rescheduled 2 weeks from now, per Dr. Devin Going.

## 2023-05-14 NOTE — Patient Instructions (Incomplete)
PAIN MANAGEMENT PROCEDURE DISCHARGE INSTRUCTIONS      1. Keep injection /surgical site dry until the band-aid/dressing is removed. Please remove the band-aid dressing   from the injection/surgical site between the first twelve to twenty-four hours after the procedure has been performed        2. If the injection/surgical site is sore, you may apply an ice pack to that area for twenty minutes every two hours for the initial twenty four hours.  DO NOT APPLY HEAT to the affected area for 3 days.    3. Occasionally you may notice slight increase in your pain after the procedure.  This should start to improve within the next twenty-four to forty-eight hours. (RFA procedure may take 3- 4 weeks for improvement in pain)    4. Remember, it may take as long as 3-4 days before you notice a gradual improvement in your pain and/or other symptoms.     5. You may continue to take your pain medication as needed.      6. DO NOT stop taking any other medications previously prescribed.      7. Unless otherwise instructed, you may continue with your normal activities once you leave the office.       8. If you are currently going to physical therapy, continue to do so unless your doctor instructs you not to.      9. If you develop any other symptoms, such as fever, rash, unusual drainage from the site, severe headache or weakness,     Please contact our office.      10. Other: If you have been prescribed anticoagulation medication and stopped for this procedure, please follow up with your prescribing physician for directions on restarting your anticoagulation.  ______________________________________________________________________________________   _______________________________________________________________________________________      Some normal possible side effects of steroids are:   *Fluid retention                                                   *Increased blood sugar                          *Dull headache   *Increased  sweating                                           *Increased appetite                                *Mood swings   *Slight increase in blood pressure                   *Weight gain                                             *Flushing      Diabetics are recommended to have a plan with their primary care provider/endocrinologist for the increase in blood sugar.     If you have any questions or concerns please contact the office at [phone].  If it is after   business hours follow the prompts to the on-call physician.  Thank you.

## 2023-05-14 NOTE — H&P (Signed)
Patient had knee steroid injection yesterday, will wait for 2 weeks

## 2023-05-28 ENCOUNTER — Ambulatory Visit: Admit: 2023-05-28 | Discharge: 2023-05-28 | Payer: MEDICARE

## 2023-05-28 DIAGNOSIS — M5412 Radiculopathy, cervical region: Secondary | ICD-10-CM

## 2023-05-28 MED ORDER — sodium chloride 0.9% 2 mL
Freq: Once | INTRAMUSCULAR | Status: AC
Start: 2023-05-28 — End: 2023-05-28
  Administered 2023-05-28: 17:00:00 via INTRAMUSCULAR

## 2023-05-28 MED ORDER — lidocaine (PF) (XYLOCAINE) 10 mg/mL (1 %) 5 mL
10 | Freq: Once | INTRAMUSCULAR | Status: AC
Start: 2023-05-28 — End: 2023-05-28
  Administered 2023-05-28: 17:00:00 via INTRAMUSCULAR

## 2023-05-28 MED ORDER — OMNIPAQUE (iohexol) 240 mg iodine/mL 3 mL
240 | Freq: Once | INTRAVENOUS | Status: AC
Start: 2023-05-28 — End: 2023-05-28
  Administered 2023-05-28: 17:00:00 via INTRASPINAL

## 2023-05-28 MED ORDER — dexamethasone (PF) (DECADRON) injection 10 mg
10 | Freq: Once | INTRAMUSCULAR | Status: AC
Start: 2023-05-28 — End: 2023-05-28
  Administered 2023-05-28: 17:00:00 via INTRAMUSCULAR

## 2023-05-28 NOTE — H&P (Addendum)
PAIN MANAGMENT PROCEDURE HISTORY AND PHYSICAL     Patient:  Chris Jefferson    History of Present Illness/General Risk Factors:      Patient is referred back to our clinic with new complaint of neck pain    Patient reports that he has had pain in his neck with spread to his shoulder blades and trapezius for several years. No inciting event, progressive over time. Pain is described as sharp and shooting. Constant in nature, but fluctuates in severity. Notices pain is worsened with cervical extension.     Feels like pain also worsened significantly with turning head to both left and right, especially while in an extended position.     He has attempted to manage this with stretching and some OTC medications. No sustained relief with this    Otherwise denies any hx of cervical surgeries.    Neck pain is 6/10 on VAS, at maximum is 8/10. Pain is aching, sharp, and shooting in nature. Pain is referred to bilateral shoulder blades and trapezius. The pain is constant. The pain is improved by rest. The pain is worse with activity.       Physical Exam:  Spurling +,   General Appearance:  normal  Heart:  normal  Airway/Neck Assessment:  normal  Chest:  normal    Other Findings:  N/A    Allergies:      Allergies   Allergen Reactions    Atorvastatin      Other reaction(s): Other - comment required  Joint pain         Current Outpatient Medications:     allopurinoL (ZYLOPRIM) 300 MG tablet, Take 300 mg by mouth daily., Disp: , Rfl:     colchicine 0.6 mg tablet, Take 0.6 mg by mouth daily., Disp: , Rfl:     famotidine (PEPCID) 40 MG tablet, Take 40 mg by mouth 2 times a day., Disp: , Rfl:     fexofenadine-pseudoephedrine (ALLEGRA-D 24) 180-240 mg per 24 hr tablet, Take 1 tablet by mouth daily., Disp: , Rfl:     lisinopriL (PRINIVIL) 20 MG tablet, Take 20 mg by mouth daily., Disp: , Rfl:     metoprolol succinate (TOPROL-XL) 25 MG 24 hr tablet, Take 25 mg by mouth daily., Disp: , Rfl:     Pre Procedure Diagnosis:   Cervical  Radiculopathy    Indication for Procedure:  Pain    Procedure:  C6-7 ESI    Planned Anesthetic Agent:   Dexamethasone    Blood thinners:  No    Diabetic?  No    Last Glucose:  No results found for: GLUCOSE    Pregnant or may be pregnant?  No    Current Pain Level:  7 /10    Existing History and Physical has been reviewed with no or noted changes:  yes    The patient is an appropriate candidate to undergo the planned procedure, sedation, and anesthesia:  yes    Informed consent was discussed with the patient, including the risks, benefits, potential complications, and any alternative options associated with the planned procedure, anesthesia, and possible use of blood:  yes    Consent form completed and signed:  yes

## 2023-05-28 NOTE — Progress Notes (Deleted)
No chief complaint on file.      Subjective History of Present Illness  HPI  Chris Jefferson is a 67 y.o. male ***    Past Medications:  {AMB [facility] OPIOIDS:[phone]}  {AMB [facility] MUSCLE RELAXERS:[phone]}  {AMB [facility] NEUROPATHICS:[phone]}  {AMB [facility] NSAIDS:[phone]}    Past Modalities:  TENS:      {Yes/No/Comments:[phone]}  Physical Therapy within last 6 months: {Yes/No/Comments:[phone]}  Chiropractor:     {Yes/No/Comments:[phone]}  Massage Therapy:    {Yes/No/Comments:[phone]}  Psychotherapy:    {Yes/No/Comments:[phone]}    Patient Complains of:  Uro-fecal Incontinence:     {Yes/No/Comments:[phone]}  Weight Gain/Loss:    {Yes/No/Comments:[phone]}  Fever/Chills:     {Yes/No/Comments:[phone]}  Weakness:     {Yes/No/Comments:[phone]}        01/03/2021   Injection Information   Procedure Date 11/30/2020   Procedure ESI   ESI Lumbar   Lumbar L5 - S1   Effect of Injection (%): 40            The following portions of the patient's history were reviewed and updated as appropriate: allergies, current medications, past family history, past medical history, past social history, past surgical history and problem list.    Histories  He has a past medical history of Hypertension and Osteoarthritis.    He has no past surgical history on file.    His family history is not on file.    He reports that he has never smoked. He has never used smokeless tobacco.   Objective   Allergies  Atorvastatin    Medications  Outpatient Encounter Medications as of 05/28/2023   Medication Sig Dispense Refill    allopurinoL (ZYLOPRIM) 300 MG tablet Take 300 mg by mouth daily.      colchicine 0.6 mg tablet Take 0.6 mg by mouth daily.      famotidine (PEPCID) 40 MG tablet Take 40 mg by mouth 2 times a day.      fexofenadine-pseudoephedrine (ALLEGRA-D 24) 180-240 mg per 24 hr tablet Take 1 tablet by mouth daily.      lisinopriL (PRINIVIL) 20 MG tablet Take 20 mg by mouth daily.      metoprolol succinate (TOPROL-XL) 25 MG  24 hr tablet Take 25 mg by mouth daily.       No facility-administered encounter medications on file as of 05/28/2023.        Review of Systems   Musculoskeletal:  Positive for neck pain.   Neurological:  Positive for headaches.     Vitals  There were no vitals taken for this visit.    Physical Exam   Neurologic Exam   Ortho Exam    Activities of Daily Living:  1. Activity Level - patient indicates as   and  .  2. Sleep Patterns - patient indicates as   and  .                                 - indicates an average of   hours of sleep per night.  3. Motivation - patient indicates as   and  .  4. Functional Level - patient indicates as   and  .           (a) Bathing - patient indicates as   and  .           (b) Dressing - patient indicates as   and  .           (  c) Transferring from bed/chair - patient indicates as   and  .           (d) Walking - patient indicates as   and  .           (e) Eating - patient indicates as   and  .           (f) Toilet Use - patient indicates as   and  .           (g) Personal Hygiene - patient indicates as   and  .  Notes:      Review of Lab Results  No results found for: WBC, RBC, HGB, HCT, MCV, MCH, MCHC, RDW, PLT, MPV, CMP, MG, BUN, INR, PTT    OARRS/eKasper Documentation (since 05/28/2020)         Value Time User    UDS Consisten with Prescriber Expectation --  NPV 05/14/2023  9:26 AM Riju Beatris Ship, MD    OARRS/eKASPER Status OARRS/eKASPER Report received and reviewed 05/14/2023  9:26 AM Riju Beatris Ship, MD    OARRS/eKASPER Consistent with Prescriber Expectations Yes 05/14/2023  9:26 AM Riju Beatris Ship, MD          Last Drug Screen    No lab values to display.        Pain Agreement -- Encounter Level:    Pain Agreement: None found at the encounter level.       Pain Agreement -- Patient Level:     [Media Unavailable] Electronic signature on 06/09/2020  4:06 PM - 1 of 2 e-signatures recorded         No results found for this or any previous visit.   I have completed  the required OARRS documentation for this patient on 05/28/2023.  Pradeep Beaubrun    Chronic Assessment Tools:      01/02/2021   Chronic Pain Assessment Tools   What number best describes how, during the past week, pain has interfered with your enjoyment of life? 3   What number best describes your pain on average in the past week: 3   What number best describes how, during the past week, pain has interfered with your general activity? 1   PEG TOTAL SCORE 7   Family/Home Resposibilities No disability   Recreation 1   Social Activity 1   Occupation 1   Occupation 1   Sexual Behavior 1   Self Care No disability   Life-Support Activities No disability   PDI TOTAL SCORE 4   Family History of Alcohol abuse -  Yes   Family History of Illegal drug use -  No   Family History of Prescription drug use -  No   Personal history of Alcohol abuse -  No   Personal history of Illegal drug abuse -  No   Personal history of Prescription drug abuse -   No   History of preadolescent sexual abuse -  No   Psychological disease - Attention-deficit/hyperactivity disorder, Obsessive-compulsive disorder, bipolar disorder, schizophrenia. No   Psychological disease - Depression No   ORT TOTAL SCORE 3   ORT RISK CATEGORY Low   1.  How often do you have mood swings? Never   2.  How often do you smoke a cigarette within an hour after you wake up?  Never   3.  How often have any of your family members, including parents and grandparents, had a problem with alcohol or drugs? Never   4.  How often  have any of your close friends had a problem with alcohol or drugs? Seldom   5.  How often have others suggested that you have a drug or alcohol problem? Never   6.  How often have you attended an AA or NA meeting? Never   7.  How often have you taken medication other than the way that it was prescribed? Never   8.  How often have you been treated for an alcohol or drug problem? Never   9.  How often have your medications been lost or stolen? Never   10.  How often have others expressed concern over your use of medications? Never   11. How often have you felt a craving for medication? Never   12. How often have you been asked to give a urine screen for substance abuse? Sometimes   13. How often have you used illegal drugs (for example, marijuana, cocaine, etc.) in the past five years? Never   14. How often, in your lifetime, have you had legal problems or been arrested? Never   SOAPP TOTAL SCORE 3   SOAPP INDICATION Negative             Investigations Reviewed:   ***    Assessment     ***   Plan     ***

## 2023-05-28 NOTE — Patient Instructions (Signed)
PAIN MANAGEMENT PROCEDURE DISCHARGE INSTRUCTIONS      1. Keep injection /surgical site dry until the band-aid/dressing is removed. Please remove the band-aid dressing   from the injection/surgical site between the first twelve to twenty-four hours after the procedure has been performed        2. If the injection/surgical site is sore, you may apply an ice pack to that area for twenty minutes every two hours for the initial twenty four hours.  DO NOT APPLY HEAT to the affected area for 3 days.    3. Occasionally you may notice slight increase in your pain after the procedure.  This should start to improve within the next twenty-four to forty-eight hours. (RFA procedure may take 3- 4 weeks for improvement in pain)    4. Remember, it may take as long as 3-4 days before you notice a gradual improvement in your pain and/or other symptoms.     5. You may continue to take your pain medication as needed.      6. DO NOT stop taking any other medications previously prescribed.      7. Unless otherwise instructed, you may continue with your normal activities once you leave the office.       8. If you are currently going to physical therapy, continue to do so unless your doctor instructs you not to.      9. If you develop any other symptoms, such as fever, rash, unusual drainage from the site, severe headache or weakness,     Please contact our office.      10. Other: If you have been prescribed anticoagulation medication and stopped for this procedure, please follow up with your prescribing physician for directions on restarting your anticoagulation.  ______________________________________________________________________________________   _______________________________________________________________________________________      Some normal possible side effects of steroids are:   *Fluid retention                                                   *Increased blood sugar                          *Dull headache   *Increased  sweating                                           *Increased appetite                                *Mood swings   *Slight increase in blood pressure                   *Weight gain                                             *Flushing      Diabetics are recommended to have a plan with their primary care provider/endocrinologist for the increase in blood sugar.     If you have any questions or concerns please contact the office at [phone].  If it is after   business hours follow the prompts to the on-call physician.  Thank you.

## 2023-05-28 NOTE — Procedures (Signed)
05/28/2023    12:49 PM 05/28/2023    12:51 PM   Pre Op Procedure   Pre Blood Pressure 137/80    Pre Respirations 16    Pre Heart Rate 58    Pre SpO2 98 %    Pre Pain Score Six    Pre Op Blood Pressure 137/80    Pre Op Respiration 16    Pre Op Pulse 58    Pre Op Pain Score 98 %    NRS Score Pre Procedure Six    Latex N    Betadine N    Drugs or Medications No    Hospitalization within last 2 weeks? No    Blood Thinner Prescribed No    Diagnosed with MRSA of Body or Spine?  No   History of passing out related to any procedure/surgery?  No   Been diagnosed with cancer  Yes   Treated with Chemotherapy or Radiation  No   Problem with Platelets  No   Bleeding Problems/Blood Platelets?  No   Problems with liver including hepatitis or cirrhosis?  No   Currently receiving kidney dialysis treatment?  No   Have you had oral steroids in the past month?  No   Have you had steroid injections in the past month?  Yes   Recent cough, cold, infections or antibiotics?  No   Pacemaker?  No   Defibrillator?  No   Have you ever had an organ transplant?  No   Diabetic?  No   Pregnant or may be pregnant?  No   Previous back surgery?  No   Previous neck surgery?  No   Been diagnosed with cancer - Comments  prostate   Have you had steroid injections in the past month? - Comments  left  knee steroid injection 05/13/2023         05/28/2023     1:13 PM   Universal Protocol   Procedure CESI C6-7   Verified Patient Name and Date of Birth Verbally   Consent: Acknowledgement of Informed Consent Signed and Witnessed - Corresponds with Patient/ Parent/ Legal Guardian Verbally Stated Procedure(s), and Scheduled Procedure(s), Form that is signed and dated.   NPO: N/A   History & Physical/updated within 24 hours: Verified   Pre-anesthesia assessment: N/A   Correct side/site(s) marked: Verified   Nursing assessment: N/A   Blood products available: N/A   Correct diagnostic and radiology test results properly labeled: N/A   Is patient on oxygen (O2)? No    Patient Name and Date of Birth: Yes   Agreement of procedure(s) to be performed: Yes   Complete and signed procedure consent form: Yes   Correct side/site marking(s) is marked and visible or documented on diagram: Yes   Correct patient position: Yes   Availability of correct implant: N/A   Diagnostic Imaging Studies properly labeled and displayed, verified by Patient Name/DOB and /or Physician: N/A   Preoperative antibiotic or fluids for irrigation purposes given: N/A   Allergy review and safety precautions based on patient history or medication use: Yes   Organ Transplant: donor and recipient blood types verified N/A   Pre-incision time out done: Yes         05/28/2023     1:13 PM   Intra Op Procedure   Attending Surgeon Chapman Fitch, MD   Assistant Surgeon Rockne Coons, MD   Local Anesthesia: Yes   Specimen Removed No         05/28/2023  1:13 PM   Post Op Procedure   BP 147/77   Resp 16   Heart Rate 55   SpO2 96 %   Pain Score Zero   Patient/Caregiver verbalized understanding? Yes   Complications during stay? No   Fluro time in seconds 6   Contrast Used? Omnipaque   Cc's of Contrast given: 2   Discharge By Verita Schneiders, RT   Discharge Date 05/28/2023   Discharge Time 13:23   Discharge to: Home       PROCEDURE IN DETAIL:       PREOPERATIVE DIAGNOSIS:    Cervical Radiculopathy    POSTOPERATIVE DIAGNOSIS:  Same.    PROCEDURE PERFORMED: Cervical ESI C6-7     PROCEDURE IN DETAIL:    Written informed consent was taken from the patient. Pre-procedure blood pressure and pulse were stable and recorded in patients clinic chart. The patient was brought to the procedure room and placed in the prone position on fluoroscopy table. The patients neck was prepped with chloraprep and draped in the usual sterile fashion.   The skin over the C6-7 space was identified under fluoroscopic guidance and infiltrated with 1% lidocaine for local anesthesia via 25 gauge needle.  A 17 gauge Tuohy needle was inserted through the  skin under fluoroscopic guidance until we got to the epidural space with loss of resistance technique.  Negative for CSF and heme.  2 ml of omnipaque contrast was injected under continuous fluoroscopic guidance and good spread was noted from C5-7 space bilaterally, confirmed in AP and contra lateral oblique view.  10 mg of dexamethasone mixed with 4 ml of preservative free normal saline was injected with minimal pressure. The needle was cleared with preservative free normal saline and removed. Band aid was applied.  Following the procedure the patient's vital signs were stable. The patient was discharged home in good condition after being given discharge instructions.    COMPLICATIONS : None       The patient was given written discharge instructions.

## 2023-06-06 NOTE — Progress Notes (Signed)
I have reviewed the results with the patient after I spoke with the radiologist who assured me that these are absolutely 100% benign stable and they were seen on a previous MRI

## 2023-06-07 NOTE — Telephone Encounter (Signed)
Spoke with patient and gave below message & made appt for today 08/2 @ 1030

## 2023-06-07 NOTE — Progress Notes (Signed)
Subjective:      Patient ID: Chris Jefferson is a 67 y.o. male.  Chief Complaint   Patient presents with    Knee Pain     Left 2 months        HPI    Patient is a 67 y.o. male who presents for follow up office visit regarding left knee pain. Gave pt a cortisone injection. Also discussed abdominal/pelvic CT results. The CT scan was ordered by GI because of flare up of diverticulitis. Explained the results overall unremarkable. Pt said he received a call from someone telling him to follow up with PCP regarding cyst on kidney. Will personally call the radiologist to discuss the results.     Impression Note from CT:  1.  No acute abdominal or pelvic process.  2.  Postoperative changes from right hemicolectomy.  3.  Stable hypoattenuating liver lesions, likely cysts. Hepatic steatosis.  4.  Hyperattenuating lesion at the lower pole left kidney, also seen on the prior MRI and likely a hemorrhagic or proteinaceous cyst.  5.  Prostatectomy.    Findings and Impression Note of Kidneys from CT:  - Kidneys: Kidneys are normal in size with symmetric enhancement. Simple appearing renal cysts. Exophytic lesion from the lower pole left kidney measuring greater than simple fluid density, 1.1 cm on series 4 image 87.   - Hyperattenuating lesion at the lower pole left kidney, also seen on the prior MRI and likely a hemorrhagic or proteinaceous cyst.     Procedure:  Cortisone injection in left knee with a lateral approach. After verbal consent, site was cleaned with alcohol+iodine and injected with 1 cc lidocaine and 2 cc triamcinolone. Tolerated well. No blood loss. Wound care given. Signs of infection, bleeding, nerve damage, tendon damage discussed.     Review of Systems   Musculoskeletal:  Positive for arthralgias (left knee).   All other systems reviewed and are negative.      Objective:   Physical Exam  Vitals and nursing note reviewed.   Constitutional:       General: He is not in acute distress.     Appearance: Normal  appearance.   Musculoskeletal:      Left knee: No effusion.   Skin:     General: Skin is warm and dry.   Neurological:      Mental Status: He is alert and oriented to person, place, and time.   Psychiatric:         Mood and Affect: Mood normal.          Lab Appointment on 06/06/2023   Component Date Value Ref Range Status    Cholesterol 06/06/2023 166  <200 mg/dL Final    Triglycerides 06/06/2023 146  <150 mg/dL Final    HDL 40/98/1191 48  40 - 60 mg/dL Final    LDL Cholesterol 06/06/2023 89  0 - 99 mg/dL Final    VLDL 47/82/9562 29  0 - 40 mg/dl Final       BP 130/86   Pulse 60   Ht 5' 11 (1.803 m)   Wt 235 lb (106.6 kg)   SpO2 95%   BMI 32.78 kg/m     Assessment and Plan:       ICD-10-CM    1. Chronic pain of left knee  M25.562 triamcinolone acetonide (KENALOG) injection 80 mg    G89.29 DISCONTINUED: triamcinolone acetonide (KENALOG) injection 60 mg      2. Stage 3a chronic kidney disease (HCC)  N18.31  3. Renal lesion  N28.9           Mr. [facility] understands and agrees with above treatment plan.             By signing my name below, I, Silvestre Gunner, attest that this documentation has been prepared under the direction of Joni Reining, DO.  Electronically signed: Silvestre Gunner, 4:18 PM, 06/07/2023

## 2023-06-18 NOTE — Nursing Note (Signed)
Why are you here today?8 month     Is there anything you want the provider to specifically discuss with you today?    Have you had any new cardiac symptoms? (chest pain/SOB/palpitations/leg swelling/ bleeding/ claudication/presyncope/syncope)   Yes   [x]   Describe symptoms : sob pt stated he bike rides   No    []      Do you need any refills?  Yes   []        No [x]      Is your pharmacy information correct?  Yes   [x]           Did you bring in your medications today? (please encourage patient to bring meds/med list for future visits)  Yes  []        No  [x]       Have you had any hospitalizations or cardiac testing since your last visit  Yes   []        No [x] 

## 2023-06-18 NOTE — Progress Notes (Signed)
[facility]  [address]  [place], [place] [address]  [phone]  [phone] Fax  [email or web address]     Assessment/Plan   ASSESSMENT & PLAN:     ASHD.  Mild coronary calcification per CTA chest, 12/03/2022  Continue secondary measure: Aspirin with statin.  Very active-caring for his grandchildren.  Biking and hiking.  No anginal symptoms    History of SVT  Had bradycardia on beta-blockers.  Reported palpitations at last visit.  Started on Toprol XL 12.5 mg daily.  Advised to avoid excessive caffeine  Reports the palpitations are mostly controlled with low-dose beta-blocker.    Essential hypertension  Blood pressure well-controlled on Toprol 12.5 mg daily and lisinopril 20 mill daily    Hyperlipidemia  Intolerant to Lipitor but tolerating Crestor  Crestor 5 mg -- 4 days a week.   LDL 45 on 03/30/2022  LDL 89 on 06/06/2023  Increase --Crestor 5 mg -- 5 days a week.    OSA. Wears CPAP.     Ascending aortic aneurysm-4 cm.  Per CTA chest, 12/03/2022    Patient here for recheck.  Patient reports being very active-biking and hiking with his grandchildren.  No reports of angina.  Patient reports occasionally DOE-only when really exerting himself.  LDL up slightly (89) on last set of labs.  Will increase Crestor 5 mg to 5 days a week.  No other medication changes.  Plan:  Increase Crestor to 5 times a week.   As of May 06, 2023 Dr. Jori Moll no longer at the practice.  Return in 8 months with Dr. Dahlia Bailiff.   Limit caffeine.         Subjective            SUBJECTIVE:     CHIEF COMPLAINT:  Chief Complaint   Patient presents with    Follow-up     8 month      HPI  HISTORY OF PRESENT ILLNESS:  Chris Jefferson is a 67 year old male who was seen in our office on 06/18/2023 for follow-up.    Last office visit on 08/17/2022 with Dr. Jori Moll: Reported worsening palpitations.  Did become bradycardic on Lopressor 25 mg twice daily.  Toprol 12.5 mg daily was started at office visit.  Blood pressure  well-controlled.  Note reviewed.    Today, patient reports able to ride his bike 20-30 miles.  Likes to hike with his grandchildren.  Denies chest pain, shortness of breath, presyncope or syncope. Palpitations better now that he is taking Toprol 12.5 mg daily.  Reports occasional DOE - climbing a big hill. No recent hospitalizations         PAST HISTORY    Past Medical History:   Diagnosis Date    Anemia     Diverticulitis     Unspecified essential hypertension      Reviewed    Past Surgical History:   Procedure Laterality Date    Other Surgical History      SURGERY FOR DIVERTICULITIS, REMOVED A THIRD OF LARGE INTESTINE    PR LASER SURGERY OF EYE       Reviewed    Family History   Problem Relation Name Age of Onset    Cancer Mother      Hypertension Mother      Hypertension Brother      Hypertension Sister       Reviewed    Social History     Socioeconomic History    Marital status: Married  Tobacco Use    Smoking status: Former     Current packs/day: 0.00     Average packs/day: 1 pack/day for 40.0 years (40.0 ttl pk-yrs)     Types: Cigarettes     Start date: 10/05/1965     Quit date: 10/05/2005     Years since quitting: 17.7    Smokeless tobacco: Former     Quit date: 10/05/2006   Substance and Sexual Activity    Alcohol use: Yes     Alcohol/week: 2.0 standard drinks of alcohol     Types: 2 Standard drinks or equivalent per week     Comment: OCCASIONALLY    Drug use: No     Reviewed    REVIEW OF SYSTEMS:  Nursing Notes:   Myrene Buddy  06/18/23 1454  Signed  Why are you here today?8 month     Is there anything you want the provider to specifically discuss with you today?    Have you had any new cardiac symptoms? (chest pain/SOB/palpitations/leg swelling/ bleeding/ claudication/presyncope/syncope)   Yes   [x]   Describe symptoms : sob pt stated he bike rides   No    []      Do you need any refills?  Yes   []        No [x]      Is your pharmacy information correct?  Yes   [x]           Did you bring in  your medications today? (please encourage patient to bring meds/med list for future visits)  Yes  []        No  [x]       Have you had any hospitalizations or cardiac testing since your last visit  Yes   []        No [x]        Answers submitted by the patient for this visit:  Follow-up Patient Intake Form (Submitted on 06/17/2023)  Chest pain: No  Nosebleeds: No  Bleeding: No  Extremity swelling: No  Decreased appetite: No  Shortness of breath: No  Shortness of breath with exertion: No  Fainting: No  Dizziness: No  Palpitations: Yes  Heartburn: Yes  Headaches: Yes  Weakness: No  Falls: No  Rash: No      ALLERGIES:  Allergies   Allergen Reactions    Lipitor [Atorvastatin] Other - comment required     Joint pain       MEDICATIONS:  Current Outpatient Medications   Medication Sig Dispense Refill    rosuvastatin (CRESTOR) 5 mg tablet TAKE 1 TABLET BY MOUTH 4 TIMES EVERY WEEK 51 Tab 3    metoprolol SUCCINATE (TOPROL-XL) 25 mg SR-tablet 24 Hr Take 0.5 Tab by mouth daily 50 Tab 3    Blood Pressure Kit Med & Lrg Kit 1 Each by Miscellaneous route daily 1 Kit 0    ascorbic acid, vitamin C, (VITAMIN C) 500 mg tablet Take 500 mg by mouth      cholecalciferol, vitamin D3, (CHOLECALCIFEROL, VIT D3,) Take by mouth daily      vitamin B complex (B COMPLEX-100 ORAL) Take by mouth      famotidine (PEPCID) 40 mg tablet Take 1 Tab by mouth daily      allopurinol (ZYLOPRIM) 300 mg tablet Take 1 Tab by mouth daily.      colchicine 0.6 mg tablet Take 0.6 mg by mouth two times a day.      celecoxib (CELEBREX) 200 mg capsule Take 1 Cap by mouth.  aspirin chewable tablet 81 mg  Chew Tab CHEWABLE tablet Take 1 Tab by mouth daily.      MV with Min-Lycopene-Lutein 0.4-300-250 mg-mcg-mcg Tablet Take  by mouth.      lisinopril (PRINIVIL,ZESTRIL) 20 mg tablet Take 1 Tab by mouth daily.  30 0    fexofenadine (ALLEGRA) 180 mg tablet Take 180 mg by mouth daily.       zinc 50 mg Tablet Take by mouth daily (Patient not taking: Reported  on 08/17/2022)      TURMERIC ORAL Take by mouth daily (Patient not taking: Reported on 08/17/2022)      psyllium husk (METAMUCIL ORAL) Take by mouth daily       No current facility-administered medications for this visit.     Objective   OBJECTIVE:     VITAL SIGNS:  Vitals:    06/18/23 1448   BP: 100/68   Site: Left arm   Position: Sitting   Cuff Size: Adult   Pulse: 75   Weight: 106.1 kg (234 lb)     Body mass index is 33.58 kg/m.    CONSTITUTIONAL:  awake, alert, cooperative, no apparent distress,  Oriented x 3  HEENT/NECK : Normal. No JVD. No thyromegaly.   LUNGS: Good respiratory effort. . On auscultation bilateral equal air entry. Normal breath sounds  CARDIOVASCULAR:  Regular rate and rhythm, normal S1 and S2, No murmur  ABDOMEN: Soft, nontender, nondistended. Bowel sounds present. No masses or tenderness.  SKIN: Warm and dry. no jaundice and no petechiae  EXTREMITIES:  No lower extremity edema. No cyanosis or clubbing.  NEUROLOGICAL : No gross focal neurological deficit  MUSCULOSKELETAL: Muscle tone: normal. No tenderness  GENITAL/URINARY: not assessed      Cardiac Testing/History:    EKG today reveals normal sinus rhythm, 75 beats a minute.    Lipid panel in Care Everywhere, 06/06/2023  Total cholesterol 166  LDL 89    Weight management and counseling:  Estimated body mass index is 33.58 kg/m as calculated from the following:    Height as of 12/03/22: 1.778 m (5' 10).    Weight as of this encounter: 106.1 kg (234 lb)..    CMS Normal Parameters: Age 19 years and older BMI =>18.5 and <25kg/m2  This was discussed with him.  Body mass index is 33.58 kg/m.: overweight, BMI management plan is:  POC exercise counseling.     Encouraged to be active.        The following portions of the patient's history were reviewed in this encounter and updated as appropriate:  Tobacco  Allergies  Meds  Problems  Med Hx  Surg Hx  Fam Hx         The patient is given an After Visit Summary sheet that lists all of their  medications with directions, their allergies, orders placed during this encounter and follow- up instructions.    Lennie Hummer, APRN  [facility]  [address]  [place] [address]  [phone]  This note is electronically signed in the electronic medical record.

## 2023-06-24 NOTE — Progress Notes (Signed)
Subjective:      Patient ID: Mark Gavia is a 67 y.o. male.  Chief Complaint   Patient presents with    Knee Pain     Left knee, requesting injection and MRI        HPI    Patient is a 67 y.o. male who presents for follow up office visit regarding left knee pain. He received a cortisone injection in his left knee about 2 weeks ago, he said the shot helped a little bit. Exam of his left knee showed medial joint pain and no effusion. Gave pt a cortisone injection. Ordered an MRI of his left knee, will call with results. Referred to orthopedic surgeon, Dr. Lelon Perla.     Procedure:  Cortisone injection in left knee with a medial approach. After verbal consent, site was cleaned with alcohol+iodine and injected with 1 cc lidocaine and 2 cc triamcinolone. Tolerated well. No blood loss. Wound care given. Signs of infection, bleeding, nerve damage, tendon damage discussed.     Review of Systems   Musculoskeletal:  Positive for arthralgias (left knee).   All other systems reviewed and are negative.      Objective:   Physical Exam  Vitals and nursing note reviewed.   Constitutional:       General: He is not in acute distress.     Appearance: Normal appearance.   Skin:     General: Skin is warm and dry.   Neurological:      Mental Status: He is alert and oriented to person, place, and time.   Psychiatric:         Mood and Affect: Mood normal.          Lab Appointment on 06/06/2023   Component Date Value Ref Range Status    Cholesterol 06/06/2023 166  <200 mg/dL Final    Triglycerides 06/06/2023 146  <150 mg/dL Final    HDL 42/59/5638 48  40 - 60 mg/dL Final    LDL Cholesterol 06/06/2023 89  0 - 99 mg/dL Final    VLDL 75/64/3329 29  0 - 40 mg/dl Final       BP 518/84   Pulse 62   Ht 5' 11 (1.803 m)   Wt 239 lb (108.4 kg)   SpO2 98%   BMI 33.33 kg/m     Assessment and Plan:       ICD-10-CM    1. Chronic pain of left knee  M25.562 triamcinolone acetonide (KENALOG) injection 80 mg    G89.29 MRI-KNEE LEFT WO CONTRAST       2. Stage 3a chronic kidney disease (HCC)  N18.31           Mr. [facility] understands and agrees with above treatment plan.             By signing my name below, I, Silvestre Gunner, attest that this documentation has been prepared under the direction of Joni Reining, DO.  Electronically signed: Silvestre Gunner, 2:17 PM, 06/24/2023

## 2023-07-02 ENCOUNTER — Ambulatory Visit: Admit: 2023-07-02 | Discharge: 2023-07-02 | Payer: MEDICARE

## 2023-07-02 DIAGNOSIS — M47812 Spondylosis without myelopathy or radiculopathy, cervical region: Secondary | ICD-10-CM

## 2023-07-02 NOTE — Progress Notes (Signed)
Chief Complaint   Patient presents with    Neck Pain       Subjective History of Present Illness  History of Present Illness  HPI  Chris Jefferson is a 67 y.o. male who is here for follow of CESI C6-7 05/28/23. Patient states that he feels he did not receive any benefit from the injection. Pain is still worse on the right side. States he has previously had benfit with knee and hip injections.     Neck pain is 6/10 on VAS, at maximum is 8/10. Pain is aching and dull in nature. Pain is not referred. The pain is constant. The pain is improved by nothing. The pain is worse with movement of the neck.    Low back pain is 4/10 on VAS, at maximum is 5/10. Pain is dull in nature. Pain is not referred. The pain is constant. The pain is improved by stretching. The pain is worse with exercise.    Bilateral hip pain is 3/10 on VAS, at maximum is 5/10. Pain is aching in nature. Pain is not referred. The pain is constant. The pain is improved by rest and stretch. The pain is worse with strenuous exercise. Marland Kitchen         Past Medications:  Tylenol      Past Modalities:  TENS:                                                              Yes  Physical Therapy within last 6 months:          Yes  Chiropractor:                                                   Yes  Massage Therapy:                                          Yes  Psychotherapy:                                               No     Patient Complains of:  Uro-fecal Incontinence:                                   No  Weight Gain/Loss:                                          No  Fever/Chills:                                                    No  Weakness:  No    Current Pain Disability Index Scale ( 0-10, 0- no disability, 10- worst)  family/ home responsibilities - 7  Recreation - 7  Social Activity - 7  Occupation - 7  Sexual Behavior - 7  Self Care - 5  Life Support activities - 5      The following portions of the patient's  history were reviewed and updated as appropriate: allergies, current medications, past family history, past medical history, past social history, past surgical history and problem list.    Histories  He has a past medical history of Hypertension and Osteoarthritis.    He has no past surgical history on file.    His family history is not on file.    He reports that he has never smoked. He has never used smokeless tobacco.   Objective   Allergies  Atorvastatin    Medications  Outpatient Encounter Medications as of 07/02/2023   Medication Sig Dispense Refill    allopurinoL (ZYLOPRIM) 300 MG tablet Take 1 tablet (300 mg total) by mouth daily.      colchicine 0.6 mg tablet Take 1 tablet (0.6 mg total) by mouth daily.      famotidine (PEPCID) 40 MG tablet Take 1 tablet (40 mg total) by mouth 2 times a day.      fexofenadine-pseudoephedrine (ALLEGRA-D 24) 180-240 mg per 24 hr tablet Take 1 tablet by mouth daily.      lisinopriL (PRINIVIL) 20 MG tablet Take 1 tablet (20 mg total) by mouth daily.      metoprolol succinate (TOPROL-XL) 25 MG 24 hr tablet Take 1 tablet (25 mg total) by mouth daily.       No facility-administered encounter medications on file as of 07/02/2023.        Review of Systems   Musculoskeletal:  Positive for neck pain.   All other systems reviewed and are negative.      Vitals  Temperature 97.7 F (36.5 C).    Physical Exam  Vitals reviewed.   Constitutional:       General: He is in acute distress.      Appearance: Normal appearance.       HENT:      Head: Normocephalic and atraumatic.      Nose: Nose normal.   Eyes:      Pupils: Pupils are equal, round, and reactive to light.   Neck:     Pulmonary:      Effort: Pulmonary effort is normal.   Abdominal:      General: Abdomen is flat.   Musculoskeletal:      Cervical back: Rigidity and tenderness present.   Skin:     General: Skin is warm.      Capillary Refill: Capillary refill takes less than 2 seconds.   Neurological:      General: No focal deficit  present.      Mental Status: He is alert and oriented to person, place, and time.        Ortho Exam    Activities of Daily Living:  1. Activity Level - patient indicates as the same and performs independently.  2. Sleep Patterns - patient indicates as the same and performs independently.                                 - indicates an average of 8 hours of sleep per night.  3. Motivation - patient indicates  as the same and performs independently.  4. Functional Level - patient indicates as the same and performs independently.           (a) Bathing - patient indicates as the same and performs independently.           (b) Dressing - patient indicates as the same and performs independently.           (c) Transferring from bed/chair - patient indicates as the same and performs independently.           (d) Walking - patient indicates as the same and performs independently.           (e) Eating - patient indicates as the same and performs independently.           (f) Toilet Use - patient indicates as the same and performs independently.           (g) Personal Hygiene - patient indicates as the same and performs independently.  Notes:      Review of Lab Results  No results found for: WBC, RBC, HGB, HCT, MCV, MCH, MCHC, RDW, PLT, MPV, CMP, MG, BUN, INR, PTT    OARRS/eKasper Documentation (since 07/02/2020)         Value Time User    UDS Consisten with Prescriber Expectation --  NPV 05/14/2023  9:26 AM Riju Beatris Ship, MD    OARRS/eKASPER Status OARRS/eKASPER Report received and reviewed 05/14/2023  9:26 AM Riju Beatris Ship, MD    OARRS/eKASPER Consistent with Prescriber Expectations Yes 05/14/2023  9:26 AM Riju Beatris Ship, MD          Last Drug Screen    No lab values to display.        Pain Agreement -- Encounter Level:    Pain Agreement: None found at the encounter level.       Pain Agreement -- Patient Level:     [Media Unavailable] Electronic signature on 06/09/2020  4:06 PM - 1 of 2 e-signatures recorded          No results found for this or any previous visit.   I have completed the required OARRS documentation for this patient on 07/02/2023.  Carvel Getting, MA    Chronic Assessment Tools:      11/30/2020 01/02/2021   Chronic Pain Assessment Tools   What number best describes how, during the past week, pain has interfered with your enjoyment of life? 8 3   What number best describes your pain on average in the past week: 8 3   What number best describes how, during the past week, pain has interfered with your general activity? 5 1   PEG TOTAL SCORE 21 7   Family/Home Resposibilities  No disability   Recreation  1   Social Activity  1   Occupation  1   Occupation  1   Sexual Behavior  1   Self Care  No disability   Life-Support Activities  No disability   PDI TOTAL SCORE  4   Family History of Alcohol abuse -  No Yes   Family History of Illegal drug use -  No No   Family History of Prescription drug use -  No No   Personal history of Alcohol abuse -  No No   Personal history of Illegal drug abuse -  No No   Personal history of Prescription drug abuse -   No No   History of preadolescent sexual abuse -  No No  Psychological disease - Attention-deficit/hyperactivity disorder, Obsessive-compulsive disorder, bipolar disorder, schizophrenia. No No   Psychological disease - Depression No No   ORT TOTAL SCORE 0 3   ORT RISK CATEGORY Low Low   1.  How often do you have mood swings? Never Never   2.  How often do you smoke a cigarette within an hour after you wake up?  Never Never   3.  How often have any of your family members, including parents and grandparents, had a problem with alcohol or drugs? Seldom Never   4.  How often have any of your close friends had a problem with alcohol or drugs? Seldom Seldom   5.  How often have others suggested that you have a drug or alcohol problem? Never Never   6.  How often have you attended an AA or NA meeting? Never Never   7.  How often have you taken medication other than the way that  it was prescribed? Never Never   8.  How often have you been treated for an alcohol or drug problem? Never Never   9.  How often have your medications been lost or stolen? Never Never   10. How often have others expressed concern over your use of medications? Never Never   11. How often have you felt a craving for medication? Never Never   12. How often have you been asked to give a urine screen for substance abuse? Never Sometimes   13. How often have you used illegal drugs (for example, marijuana, cocaine, etc.) in the past five years? Never Never   14. How often, in your lifetime, have you had legal problems or been arrested? Never Never   SOAPP TOTAL SCORE 2 3   SOAPP INDICATION Negative Negative           Investigations Reviewed:   Patient Name: Calab Katzenmeyer   Case ID: [id number]   Patient DOB: 08/20/1956   Referring Physician: Roselind Messier, MD   Exam Date: 09/05/2020   Exam Description: MR Lumbar Spine w/o Contrast     HISTORY:  Right-sided low back pain and right lower extremity radiculopathy.  Symptoms for 6 months.   TECHNICAL FACTORS:  Long- and short-axis fat- and water-weighted images were performed.   COMPARISON:  None.   FINDINGS:  No substantial scoliosis.   No fracture seen.  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  See labeled key images.   Conus terminates at T12-L1.   Findings at individual levels demonstrate:   L1-2 mild-to-moderate facet hypertrophy with no neural compression.   L2-3 1mm retrolisthesis.  Bilobed spondylotic disc displacement and mild-to-moderate facet and ligamentum flavum hypertrophy with mild facet capsulitis.  Minor bilateral foramen stenosis.   L3-4 mild diffuse spondylotic disc displacement.  4mm focal right lateral recess caudally-migrating disc herniation abuts descending right L4 nerve root.  Mild-to-moderate facet and ligamentum flavum hypertrophy.  No substantial foramen  stenosis.   L4-5 diffuse spondylotic disc displacement asymmetric to the left with moderate facet hypertrophy.  Gentle abutment of both exiting L5 nerve roots.   L5-S1 transitional.  No neural compression.   Approximately 12mm T1 and T2 dark focus in the left sacral ala indeterminate although most likely a bone island.  Correlate with PSA in elderly male.   No hydronephrosis.  Small renal cysts.  No aortic aneurysm or retroperitoneal adenopathy.     CONCLUSION:  1. 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  See labeled key images.   2. L3-4 mild diffuse spondylotic disc displacement.  4mm focal right lateral recess disc caudally-migrating disc herniation abuts descending right L4 nerve root.  Mild-to-moderate facet and ligamentum flavum hypertrophy. No substantial foramen stenosis.   3. L4-5 diffuse spondylotic disc displacement asymmetric to the left with moderate facet hypertrophy.  Gentle abutment of both exiting L5 nerve roots.   4. An approximately 12mm T1 and T2 dark focus in the left sacral ala indeterminate although most likely a bone island. Correlate with PSA in elderly male.   Thank you for the opportunity to provide your interpretation.   Blima Ledger, MD      ASSESSMENT:  Low back pain  Lumbar Radiculopathy  DDD  Chronic pain syndrome  Cervical Spondylosis       PLAN:  1. UDS deferred.   2. We discussed with the patient that we are a multimodal pain management center where we acknowledge the limitations of (high dose) opioid monotherapy, but rather approach pain simultaneously from many modalities. We use two or more analgesic agents with different mechanisms of action. This combination allows for added analgesia and might have synergistic effect and allow for better analgesia with the use of lower doses of a given medication than if the drug were used alone. The medications may consist of the use of opioid  and nonopioid pharmacologic agents. We also employ interventional procedures like injections to further control the pain.  3. Continue with HEP and remain as active as possible  4. Patient has pain in the neck, non radicular in nature, has failed three months of conservative therapy including but not limited to PT and pharmacological management, it interferes with activities of daily living, has bilateral C 4-7 facet tenderness positive, increased with extension of the head. Discussed bilateral  C 5 to C 7 MBB under fluoro which is diagnostic in nature and if pain improves will be followed by RFA for long term pain relief.  Risk includes infection, bleeding, nerve damage, post dural puncture headache, paraplegia. Patient understands and agrees with the plan.  5. He did well with L5-S1 ESI with good improvement. Can consider repeating this in the future.  6. He is reporting bilateral hip and hip bursa pain and tenderness. Differential includes hip joint pain vs trochanteric bursitis. Discussed with the patient regarding the etiology of their pain. Will obtain XR of both hips today.  Informed them that they would likely benefit from either bilateral hip intraarticular or trochanteric bursa injections. Will see results and xray, decide and have this procedure applied for and scheduled when approved.     Follow up for injections and 4 weeks after.     Jerilee Field, DO  Anesthesiology, PGY3    ATTENDING NOTE  I have seen and examined the patient with the fellow, I agree with his assessment, reviewed his note and discussed care with him. I agree with his history, physical examination, assessment and plan as noted above.   Lashunta Frieden Devin Going MBBS, MD

## 2023-07-04 NOTE — Telephone Encounter (Addendum)
-----   Message from Dr. Ellard Artis sent at 07/02/2023  3:57 PM EDT -----  Regarding: mdcr  Please schedule Thanks    1st MBB:  [facility] ([id number], [phone]) bilateral MBB C 5- 7. Dx [phone]) Cervical Spondylosis    Faxed to [facility] / [employer] 07/04/23, PENDING

## 2023-07-10 NOTE — Telephone Encounter (Signed)
APPROVED [phone], [id number]) MBB per [employer] fax recd 07/10/23    1st MBB:  [facility] ([id number], [id number]) bilateral MBB C 5- 7. Dx [phone]) Cervical Spondylosis    UTN# [id number]  07/11/23 - 11/10/2023                  OK to schedule Dr. Devin Going

## 2023-07-12 NOTE — Telephone Encounter (Signed)
Scheduled    Is patient having any of the following procedures? If YES, proceed to blood thinner question. If NO, proceed to diabetes question.  Required to hold blood thinners for:   - Lumbar Epidural   - Caudal Epidural   - Cervical Epidural   - Thoracic Epidural   - Selective Nerve Root Blocks (SNRB) / Transforaminal Epidural (TFESI)   - Stellate Ganglion Block   - Celiac Plexus Block   - Hypogastric Plexus Block   - Lumbar Sympathetic Block   - Ganglion Impar Block   - Cervical Medial Branch Block   - Cervical RFA   - Intercostal Nerve Block   - Intercostal RFA   - Trigeminal Nerve Blocks - For Dr. Garg ONLY   - Thoracic Medial Branch Block   - Thoracic RFA           Are you on any blood thinners? If answers yes, is there a clearance on file within last 12 months? If not, send clearance request to hold before scheduling.  No        Do you have diabetes?  No    If yes, do you check your blood sugars and are they controlled? - [If steroid injection is being given, sugar must be under 200 prior to procedure or will need to reschedule (Note: does not apply to RFA procedures or local only procedures where steroid is not used)]   not applicable      Are you taking oral steroids or have you had any other steroid injections in the last 2 weeks? (Note: Steroids being taken for chronic conditions are OK)  No      Do you have any current infection (I.E. Cold, Flu, COVID symptoms, UTI etc.)?  No  NOTE: If yes, must take FULL COURSE of antibiotics prescribed AND be symptom free for at least 7-10 days after last antibiotic dose before rescheduling.      Have you had any COVID vaccines or boosters in the last 10 days?  No      Driver needed for following procedures:   - ALL RFAs of LUMBAR, CERVICAL, THORACIC spine   - ANY/ALL CERVICAL Procedures   - Stellate Ganglion Block   - Lumbar Sympathetic Block   - Hypogastric Plexus Block   - Celiac Plexus Block   - Selective Nerve Root Block (SNRB) / Transforaminal ESI (TFESI)    -  Hip Injection - PREFERRED   - LESI - Required for Dr. Nguyen ONLY

## 2023-08-29 ENCOUNTER — Ambulatory Visit: Admit: 2023-08-29 | Discharge: 2023-08-29 | Payer: MEDICARE

## 2023-08-29 DIAGNOSIS — M5412 Radiculopathy, cervical region: Secondary | ICD-10-CM

## 2023-08-29 DIAGNOSIS — M47812 Spondylosis without myelopathy or radiculopathy, cervical region: Secondary | ICD-10-CM

## 2023-08-29 MED ORDER — lidocaine (XYLOCAINE) 20 mg/mL (2 %) injection 10 mL
20 | Freq: Once | INTRAMUSCULAR | Status: AC
Start: 2023-08-29 — End: 2023-08-29
  Administered 2023-08-29: 13:00:00

## 2023-08-29 MED ORDER — lidocaine (PF) (XYLOCAINE) 10 mg/mL (1 %) 10 mL
10 | Freq: Once | INTRAMUSCULAR | Status: AC
Start: 2023-08-29 — End: 2023-08-29
  Administered 2023-08-29: 13:00:00 via INTRAMUSCULAR

## 2023-08-29 NOTE — Procedures (Signed)
No data to display                     No data to display                      08/29/2023     8:55 AM 08/29/2023     8:59 AM   Pre Op Procedure   Pre Blood Pressure 129/75    Pre Respirations 16    Pre Heart Rate 54    Pre SpO2 96 %    Pre Pain Score Five    Pre Op Blood Pressure 129/75    Pre Op Respiration 16    Pre Op Pulse 54    Pre Op Pain Score 96 %    NRS Score Pre Procedure Five    Latex N    Betadine N    Drugs or Medications Yes    Hospitalization within last 2 weeks? No    Blood Thinner Prescribed No    Diagnosed with MRSA of Body or Spine?  No   History of passing out related to any procedure/surgery?  No   Been diagnosed with cancer  Yes   Treated with Chemotherapy or Radiation  No   Problem with Platelets  No   Bleeding Problems/Blood Platelets?  No   Problems with liver including hepatitis or cirrhosis?  No   Currently receiving kidney dialysis treatment?  No   Have you had oral steroids in the past month?  No   Have you had steroid injections in the past month?  No   Recent cough, cold, infections or antibiotics?  No   Pacemaker?  No   Defibrillator?  No   Have you ever had an organ transplant?  No   Diabetic?  No   Pregnant or may be pregnant?  No   Previous back surgery?  No   Previous neck surgery?  No   Been diagnosed with cancer - Comments  prostate   Have you had steroid injections in the past month? - Comments  left  knee steroid injection 05/13/2023         08/29/2023     9:14 AM   Universal Protocol   Procedure Bilateral MBB C5-C7 under Fluoro   ###LOCAL ONLY###   Verified Patient Name and Date of Birth Verbally   Consent: Acknowledgement of Informed Consent Signed and Witnessed - Corresponds with Patient/ Parent/ Legal Guardian Verbally Stated Procedure(s), and Scheduled Procedure(s), Form that is signed and dated.   NPO: No   History & Physical/updated within 24 hours: Verified   Pre-anesthesia assessment: N/A   Correct side/site(s) marked: Verified   Nursing assessment: Verified    Blood products available: N/A   Correct diagnostic and radiology test results properly labeled: N/A   Is patient on oxygen (O2)? No   Patient Name and Date of Birth: Yes   Agreement of procedure(s) to be performed: Yes   Complete and signed procedure consent form: Yes   Correct side/site marking(s) is marked and visible or documented on diagram: Yes   Correct patient position: Yes   Availability of correct implant: N/A   Diagnostic Imaging Studies properly labeled and displayed, verified by Patient Name/DOB and /or Physician: N/A   Preoperative antibiotic or fluids for irrigation purposes given: N/A   Allergy review and safety precautions based on patient history or medication use: Yes   Organ Transplant: donor and recipient blood types verified N/A   Pre-incision time  out done: Yes         08/29/2023     9:15 AM   Intra Op Procedure   Attending Surgeon Chapman Fitch, MD   Assistant Surgeon Gladstone Pih, DO   Local Anesthesia: Yes   Specimen Removed No         08/29/2023     9:16 AM   Post Op Procedure   BP 147/82   Resp 16   Heart Rate 53   SpO2 96 %   Pain Score Six   Patient/Caregiver verbalized understanding? Yes   Complications during stay? No   Fluro time in seconds 8   Discharge By Ansel Bong, RT   Discharge Date 08/29/2023   Discharge Time 09:26   Discharge to: Home       PROCEDURE IN DETAIL:   PRE OPERATIVE DIAGNOSIS:  Cervical Spondylosis    POST OPERATIVE DIAGNOSIS:  Same.    PROCEDURE:  Bilateral C 5, 6, 7 Medial Branch Block under fluoro, MBB as part of diagnostic treatment.     PROCEDURE DETAILS:   After obtaining written informed consent patient was taken to the procedure room. Pre-procedure blood pressure and pulse were stable and recorded in patients clinic chart. The patient was placed in a prone position and the neck area was prepped with chloraprep and draped in the usual sterile fashion.  The skin was infiltrated with 1% lidocaine at the junction of transverse process with the  vertebral body at the left C 7 level. A 22-gauge, 3.5-inch needle was inserted into the medial branch under radiographic guidance. Both AP and lateral views were obtained.   Following placement of the needle and negative aspiration, 1.0 cc 2% lidocaine was injected. The identical procedure was performed on the left C 5, 6 medial branch. The procedure was repeated on the right side to get right C 5, 6, 7 medial branch. A total of 6 cc of 2% lidocaine was injected. During the procedure there was no evidence of CSF, paresthesias or heme.    After the procedure the needles were removed. Skin was cleaned and a sterile dressing was applied.    Following the procedure the patient's vital signs were stable. The patient was discharged home in good condition after being given discharge instructions.    COMPLICATIONS : None         The patient was given written discharge instructions.

## 2023-08-29 NOTE — Patient Instructions (Addendum)
PAIN MANAGEMENT PROCEDURE DISCHARGE INSTRUCTIONS      1. Keep injection /surgical site dry until the band-aid/dressing is removed. Please remove the band-aid dressing   from the injection/surgical site between the first twelve to twenty-four hours after the procedure has been performed        2. If the injection/surgical site is sore, you may apply an ice pack to that area for twenty minutes every two hours for the initial twenty four hours.  DO NOT APPLY HEAT to the affected area for 3 days.    3. Occasionally you may notice slight increase in your pain after the procedure.  This should start to improve within the next twenty-four to forty-eight hours. (RFA procedure may take 3- 4 weeks for improvement in pain)    4. Remember, it may take as long as 3-4 days before you notice a gradual improvement in your pain and/or other symptoms.     5. You may continue to take your pain medication as needed.      6. DO NOT stop taking any other medications previously prescribed.      7. Unless otherwise instructed, you may continue with your normal activities once you leave the office.       8. If you are currently going to physical therapy, continue to do so unless your doctor instructs you not to.      9. If you develop any other symptoms, such as fever, rash, unusual drainage from the site, severe headache or weakness,     Please contact our office.      10. Other: If you have been prescribed anticoagulation medication and stopped for this procedure, please follow up with your prescribing physician for directions on restarting your anticoagulation.  ______________________________________________________________________________________   _______________________________________________________________________________________      Some normal possible side effects of steroids are:   *Fluid retention                                                   *Increased blood sugar                          *Dull headache   *Increased  sweating                                           *Increased appetite                                *Mood swings   *Slight increase in blood pressure                   *Weight gain                                             *Flushing      Diabetics are recommended to have a plan with their primary care provider/endocrinologist for the increase in blood sugar.     If you have any questions or concerns please contact the office at [phone].  If it is after  business hours follow the prompts to the on-call physician.  Thank you.

## 2023-08-29 NOTE — H&P (Signed)
PAIN MANAGMENT PROCEDURE HISTORY AND PHYSICAL     Patient:  Chris Jefferson    History of Present Illness/General Risk Factors:  67 yo M here for #1 bilateral Cervical MBB C5-C7 local only lido 2%    Physical Exam:  wnl  General Appearance:  normal  Heart:  normal  Airway/Neck Assessment:  normal  Chest:  normal    Other Findings:  Bilateral C 5-7 Facet tenderness present, facet loading positive     Allergies:      Allergies   Allergen Reactions    Atorvastatin      Other reaction(s): Other - comment required  Joint pain         Current Outpatient Medications:     allopurinoL (ZYLOPRIM) 300 MG tablet, Take 1 tablet (300 mg total) by mouth daily., Disp: , Rfl:     colchicine 0.6 mg tablet, Take 1 tablet (0.6 mg total) by mouth daily., Disp: , Rfl:     famotidine (PEPCID) 40 MG tablet, Take 1 tablet (40 mg total) by mouth 2 times a day., Disp: , Rfl:     fexofenadine-pseudoephedrine (ALLEGRA-D 24) 180-240 mg per 24 hr tablet, Take 1 tablet by mouth daily., Disp: , Rfl:     lisinopriL (PRINIVIL) 20 MG tablet, Take 1 tablet (20 mg total) by mouth daily., Disp: , Rfl:     metoprolol succinate (TOPROL-XL) 25 MG 24 hr tablet, Take 1 tablet (25 mg total) by mouth daily., Disp: , Rfl:     Pre Procedure Diagnosis:  Cervical Spondylosis    Indication for Procedure:  Pain    Procedure:  #1 bilateral Cervical MBB C5-C7 local only lido 2%    Planned Anesthetic Agent:  Local    Blood thinners:  No    Diabetic?  No    Last Glucose:  No results found for: GLUCOSE    Pregnant or may be pregnant?  No    Current Pain Level:  5 /10    Existing History and Physical has been reviewed with no or noted changes:  yes    The patient is an appropriate candidate to undergo the planned procedure, sedation, and anesthesia:  yes    Informed consent was discussed with the patient, including the risks, benefits, potential complications, and any alternative options associated with the planned procedure, anesthesia, and possible use of blood:   yes    Consent form completed and signed:  yes      Mayford Knife D.O. PGY-5  [facility]  Pain Management Fellow

## 2023-08-30 NOTE — Progress Notes (Signed)
Physical Therapy Treatment Note    Visit Number:  2--PREMADE TREATMENT NOTE FOR FOLLOW UP VISIT--NOT EVAL    Encounter diagnosis:      ICD-10-CM    1. Acute pain of left knee  M25.562       2. Stiffness of left knee  M25.662       3. Weakness of left lower extremity  R29.898       4. Difficulty walking due to knee joint  R26.2             Referring provider:  Marvis Moeller, DO  Primary Medical Diagnosis: Left PF joint OA, lateral meniscus tear  Plan of Care/Certification Interval:  08/30/2023 to 10/25/2023     PR (4 weeks) due 09-30-23   PR and/or POC (8 weeks) due 10-30-23   Precautions:           Subjective   Adherence to home program:    Patient reported improvements:    Patient reported continued functional deficits:  Leisure activities/sports; Bending/kneeling; Rising from chair; Standing; Prolonged walking; Walking on uneven surfaces; Getting in/out of bathtub; Getting on/off toilet; Donning shoes     Pain Location:    Pain Score:  /10      Objective / Body Functions and Performance Skills    Strength  Knee: Active         Right Left         Flexion: 140 115        Extension: 0 0        Today's treatment:  Therapeutic exercise (as per flowsheet) range of motion, strength, and proprioception to the left LE/knee; stretching at table with therapist hams, quad/hip flexor  Joint mobilization Tib-Fem lat/med glides Grade I-II-III     Education completed:  Education completed with patient.  Reviewed goals and Plan of Care. Patient/Family in agreement.  Patient educated regarding their diagnosis/prognosis.    Patient educated regarding their home exercise program.    Demonstration(s) completed.  Home program issued.  Handout(s) Issued.  Access Code: [email or web address]  URL: [email or web address]  Date: 08/30/2023  Prepared by: Alric Seton  Exercises  - Sit to Stand  - 1-2 x daily - 2-3 sets - 10 reps - 2 hold  - sciatic nerve glides (Mirrored)  - 12 x daily - 5 reps  - Thomas Stretch on Table  - 1-2 x daily  - 3 sets - 30 hold  - Straight Leg Raise  - 1-2 x daily - 2-3 sets - 10 reps - 5 hold  --Need to add Heel slide--       Response to treatment:    Goals        Treatment Plan:   Plan of Care:    CPT Code Time   TE    MT    TA    N/Re-ed    Gait    AQ    Traction    E/stim    HP/CP    BFR    LLLT    Eval        Timed Code Treatment Time:    Total Treatment Time:    DATE: 09-03-23          Therapeutic Exercise:             Bike/  Nu step Pt has stat bike at home, yet had not rode it --trial the bike*          Incline Bd   *  FT: TKE   *          Leg Curl (seated) *          Leg Press   *          Multi-hip:  Flex/Ext/Abd/Add           FT: Walk outs             Sidesteps with Tband             Step ups *          Wobble Board/BAPS             [facility]             Functional Grid:  LE/UE reach                        Therapeutic   Activity:           Transfers             Squats    HEP          Bridging                          Neuromuscular Re-Education:           Rebounder             Environmental health practitioner / [facility]             Foam Marching             SLS             Jumping:  DL, SL, Box Drop           Floor Ladder

## 2023-09-06 NOTE — Progress Notes (Signed)
Physical Therapy Treatment Note    Visit Number:  3    Encounter diagnosis:      ICD-10-CM    1. Acute pain of left knee  M25.562       2. Stiffness of left knee  M25.662       3. Weakness of left lower extremity  R29.898       4. Difficulty walking due to knee joint  R26.2             Referring provider:  Marvis Moeller, DO  Primary Medical Diagnosis: Left PF joint OA, lateral meniscus tear  Plan of Care/Certification Interval:  08/30/2023 to 10/25/2023     PR (4 weeks) due 09-30-23   PR and/or POC (8 weeks) due 10-30-23   Precautions:         Subjective   Adherence to home program:  yes  Patient reported improvements:  pt reported his left knee was feeling better today. Pt reported no pain in his left knee just stiffness and feels swollen. Pt stated the KT felt good when applied, yet did not feel too different when taking off.  Patient reported continued functional deficits:  Leisure activities/sports; Bending/kneeling; Rising from chair; Standing; Prolonged walking; Walking on uneven surfaces; Getting in/out of bathtub; Getting on/off toilet; Donning shoes     Pain Location:  left knee  Pain Score:  0/10      Objective / Body Functions and Performance Skills  Pt ambulated into the dept no AD and demo'd equal step length and weight shift. Pt did demonstrate occasional twinges of pain as he ambulated in the dept.    TTP left hip adductors and pes anserine.  Left gastroc flexibility 0-10  Hams 90-90: -35  Thomas test: 0-12    Strength  Knee: Active         Right Left         Flexion: 140 121        Extension: 0 0        Today's treatment:  Therapeutic exercise (as per flowsheet) range of motion, strength, and proprioception to the left LE/knee; stretching at table with therapist hams, quad/hip flexor. Reviewed HEP  STM to left ITB to improve tissue mobility. Joint mobilization Tib-Fem lat/med glides Grade I-II-III, PF med glides     Education completed:  Education completed with patient.  Reviewed goals and Plan  of Care. Patient/Family in agreement.  Patient educated regarding their diagnosis/prognosis.    Patient educated regarding their home exercise program.    Demonstration(s) completed.  Home program issued.  Handout(s) Issued.  Access Code: [email or web address]  URL: [email or web address]  Date: 08/30/2023  Prepared by: Alric Seton  Exercises  - Sit to Stand  - 1-2 x daily - 2-3 sets - 10 reps - 2 hold  - sciatic nerve glides (Mirrored)  - 12 x daily - 5 reps  - Thomas Stretch on Table  - 1-2 x daily - 3 sets - 30 hold  - Straight Leg Raise  - 1-2 x daily - 2-3 sets - 10 reps - 5 hold  --Need to add Heel slide--  09/03/23: reviewed HEP and instructed pt in quad stretch on a chair 3-5 reps for 15 count     Response to treatment:  Pt responding well to PT and states he is surprised of the decrease in pain and mobility. Tolerated session with fatigue yet no increased pain.    OP THERAPY GOALS:   Patient Goal:  Avoid  surgery if possible  Rid of knee pain    Short Term Goals: To be met by 30 days as discussed and agreed upon with the patient    To be met in 2 weeks  Patient Independent with established HEP with illustrations.  Patient compliant with all established precautions pertaining to discussed diagnosis.  Pain to decrease to <2/10 while resting and simple ADLs.  Decrease swelling of involved knee Midpat circumference to within 1.0 cm of uninvolved.  Improve PF Joint mobility to Grade 2-3 to allow improved joint mechanics.  Improve AAROM of involved knee to 0-130 degrees.  Improve flexibility of gastroc to 0-10 degrees to allow proper mechanics and motion.  Patient will be able to ambulate on level surfaces with appropriate assistive device with appropriate gait mechanics for this stage of rehabilitation.  Pt will be able to perform ADLs of dressing, sit to stand with slight modification in tasks.      Long Term Goals: To be met by discharge as discussed and agreed upon with patient    Expected to be met in 4  weeks  Patient Independent with updated and established HEP with illustrations.  Pain to decrease to <1/10 with all ADLs.  Increased strength of quads and hams to 5/5 MMT to allow squatting and lifting.   Patient will be able to ambulate on all surfaces without assistive device with appropriate gait mechanics.  Patient able to ascend and descend 10 steps with good eccentric control and no compensations with handrail.  Pt will be able to perform ADLs of walking with no modification in tasks.  Pt will be able to perform ADLs of stairs with no modification in tasks.  Pt will be able to perform ADLs of squatting with no modification in tasks.  Patient will be able to static SLS on involved lower extremity with minimal sway and eyes open for 10 seconds.        Precautions:          Weight Bearing Status: FWB  Treatment Plan:   Plan of Care:  Frequency: 2 X per week.  Duration:  4  week(s)  Treatment Plan to Include: Therapeutic Exercise CPT 97110, Manual Therapy CPT 97140, Gait Training CPT L092365, Neuromuscular Re Ed CPT [id number], Therapeutic Activities CPT 97530, Kinesio Tape, Modalities As Needed, Cold Laser and Ice CPT 97010    CPT Code Time   TE 20'   MT 12'   TA    N/Re-ed    Gait    AQ    Traction    E/stim    HP/CP    BFR    LLLT    Eval        Timed Code Treatment Time:  75'  Total Treatment Time:  64'  DATE: 09-03-23 09-06-23         Therapeutic Exercise:             Bike/  Nu step Bike 5 min  L5 Bike 5 min  L5         Incline Bd   30 x 2 30 x 2         FT: TKE   2pl  1 x 20 2pl  1 x 20         Leg Curl (seated) Seated 2pl  2 x 10 Seated 2pl  2 x 10         Leg Press   LP 90#  2 x 10 LP 130#  3  x 10         Multi-hip:  Flex/Ext/Abd/Add           FT: Walk outs             Sidesteps with Tband             Step ups 4 step up   x 10 6 step up  2 x 10         Wobble Board/BAPS             [facility]             Functional Grid:  LE/UE reach                        Therapeutic   Activity:           Transfers              Squats    HEP 10 x         Bridging                          Neuromuscular Re-Education:           Rebounder             Environmental health practitioner / [facility]             Foam Marching             SLS             Jumping:  DL, SL, Box Drop           Floor Ladder

## 2023-09-10 NOTE — Progress Notes (Signed)
Physical Therapy Treatment Note    Visit Number:  4    Encounter diagnosis:      ICD-10-CM    1. Acute pain of left knee  M25.562       2. Stiffness of left knee  M25.662       3. Weakness of left lower extremity  R29.898       4. Difficulty walking due to knee joint  R26.2             Referring provider:  Marvis Moeller, DO  Primary Medical Diagnosis: Left PF joint OA, lateral meniscus tear  Plan of Care/Certification Interval:  08/30/2023 to 10/25/2023     PR (4 weeks) due 09-30-23   PR and/or POC (8 weeks) due 10-30-23   Precautions:         Subjective   Adherence to home program:  yes  Patient reported improvements:  pt reported his left knee was feeling a little swollen today. Pt stated he doesn't know what caused the swelling.  Patient reported continued functional deficits:  Leisure activities/sports; Bending/kneeling; Rising from chair; Standing; Prolonged walking; Walking on uneven surfaces; Getting in/out of bathtub; Getting on/off toilet; Donning shoes     Pain Location:  left knee  Pain Score:  0/10      Objective / Body Functions and Performance Skills  Pt ambulated into the dept and demo'd equal step length and weight shift.   Left mid pat girth 43.5 cm, right: 42.2 cm  TTP left hip adductors and pes anserine. Tenderness relieved with ischemic compressions  Left gastroc flexibility 0-12  Hams 90-90: -35      Strength  Knee: Active         Right Left         Flexion: 140 130        Extension: 0 0        Today's treatment:  Therapeutic exercise (as per flowsheet) range of motion, strength, and proprioception to the left LE/knee; stretching at table with therapist hams, quad/hip flexor. Reviewed HEP  STM to left ILE to increase circulation and improve tissue mobility. Joint mobilization Tib-Fem lat/med glides Grade I-II-III, PF med glides     Education completed:  Education completed with patient.  Reviewed goals and Plan of Care. Patient/Family in agreement.  Patient educated regarding their  diagnosis/prognosis.    Patient educated regarding their home exercise program.    Demonstration(s) completed.  Home program issued.  Handout(s) Issued.  Access Code: [email or web address]  URL: [email or web address]  Date: 08/30/2023  Prepared by: Alric Seton  Exercises  - Sit to Stand  - 1-2 x daily - 2-3 sets - 10 reps - 2 hold  - sciatic nerve glides (Mirrored)  - 12 x daily - 5 reps  - Thomas Stretch on Table  - 1-2 x daily - 3 sets - 30 hold  - Straight Leg Raise  - 1-2 x daily - 2-3 sets - 10 reps - 5 hold  --Need to add Heel slide--  09/03/23: reviewed HEP and instructed pt in quad stretch on a chair 3-5 reps for 15 count     Response to treatment:  Patient presented to clinic reporting swelling since last session. Patient tolerated clinic activities well, demonstrating good endurance with ex's.  Post session pt demo'd  increased girth, increased gastroc flexibilit, increased AROM and reported decreased tenderness. Therapy team feels patient may benefit from continued skilled therapy intervention to address remaining deficits.  OP THERAPY GOALS:   Patient Goal:  Avoid surgery if possible  Rid of knee pain    Short Term Goals: To be met by 30 days as discussed and agreed upon with the patient    To be met in 2 weeks  Patient Independent with established HEP with illustrations.  Patient compliant with all established precautions pertaining to discussed diagnosis.  Pain to decrease to <2/10 while resting and simple ADLs.  Decrease swelling of involved knee Midpat circumference to within 1.0 cm of uninvolved.  Improve PF Joint mobility to Grade 2-3 to allow improved joint mechanics.  Improve AAROM of involved knee to 0-130 degrees.(MET 09/10/23)  Improve flexibility of gastroc to 0-10 degrees to allow proper mechanics and motion.(MET 09/10/23)  Patient will be able to ambulate on level surfaces with appropriate assistive device with appropriate gait mechanics for this stage of rehabilitation. (MET  09/10/23)  Pt will be able to perform ADLs of dressing, sit to stand with slight modification in tasks. (MET 09/10/23)      Long Term Goals: To be met by discharge as discussed and agreed upon with patient    Expected to be met in 4 weeks  Patient Independent with updated and established HEP with illustrations.  Pain to decrease to <1/10 with all ADLs.  Increased strength of quads and hams to 5/5 MMT to allow squatting and lifting.   Patient will be able to ambulate on all surfaces without assistive device with appropriate gait mechanics.  Patient able to ascend and descend 10 steps with good eccentric control and no compensations with handrail.  Pt will be able to perform ADLs of walking with no modification in tasks.  Pt will be able to perform ADLs of stairs with no modification in tasks.  Pt will be able to perform ADLs of squatting with no modification in tasks.  Patient will be able to static SLS on involved lower extremity with minimal sway and eyes open for 10 seconds.        Precautions:          Weight Bearing Status: FWB  Treatment Plan:   Plan of Care:  Frequency: 2 X per week.  Duration:  4  week(s)  Treatment Plan to Include: Therapeutic Exercise CPT 97110, Manual Therapy CPT 97140, Gait Training CPT L092365, Neuromuscular Re Ed CPT [id number], Therapeutic Activities CPT 97530, Kinesio Tape, Modalities As Needed, Cold Laser and Ice CPT 97010    CPT Code Time   TE 64'   MT 12'   TA    N/Re-ed    Gait    AQ    Traction    E/stim    HP/CP    BFR    LLLT    Eval        Timed Code Treatment Time:  48'  Total Treatment Time:  17'  DATE: 09-03-23 09-06-23 09/10/23        Therapeutic Exercise:             Bike/  Nu step Bike 5 min  L5 Bike 5 min  L5 Bike 5 min L5        Incline Bd   30 x 2 30 x 2 30 x 2        FT: TKE   2pl  1 x 20 2pl  1 x 20 2pl  1 x 20        Leg Curl (seated) Seated 2pl  2 x 10 Seated 2pl  2  x 10 Seated 2.5pl  2 x 10        Leg Press   LP 90#  2 x 10 LP 130#  3 x 10 LP 130#  3 x 10         Multi-hip:  Flex/Ext/Abd/Add           FT: Walk outs             Sidesteps with Tband             Step ups 4 step up   x 10 6 step up  2 x 10 6 step up  2 x 10        Wobble Board/BAPS             [facility]             Functional Grid:  LE/UE reach                        Therapeutic   Activity:           Transfers             Squats    HEP 10 x X 10        Bridging                          Neuromuscular Re-Education:           Rebounder             Environmental health practitioner / [facility]             Foam Marching             SLS             Jumping:  DL, SL, Box Drop           Floor Ladder

## 2023-09-17 ENCOUNTER — Ambulatory Visit: Admit: 2023-09-17 | Discharge: 2023-09-17 | Payer: MEDICARE

## 2023-09-17 DIAGNOSIS — M47812 Spondylosis without myelopathy or radiculopathy, cervical region: Secondary | ICD-10-CM

## 2023-09-17 DIAGNOSIS — M7918 Myalgia, other site: Secondary | ICD-10-CM

## 2023-09-17 NOTE — Telephone Encounter (Addendum)
-----   Message from Dr. Ellard Artis sent at 09/17/2023 10:27 AM EST -----  Regarding: mdcr  Please schedule Thanks    Sachdeva [phone]) trigger point injection. 52 Dx (M79.18) Myofascial Pain    Medicare AB - No PA Req              OK to schedule Dr. Devin Going

## 2023-09-17 NOTE — Progress Notes (Signed)
Chief Complaint   Patient presents with    Neck Pain     All over the neck       Subjective History of Present Illness  Neck Pain       Chris Jefferson is a 67 y.o. male who is here for follow up of chronic neck pain, s/p Bilateral C5-C7 MBB local only on 08/29/23 with minimal relief. States he tore his left meniscus after working on a deck, actively participating in PT.     Neck pain is 5/10 on VAS, at maximum is 8/10. Pain is aching and deep in nature. Pain is not referred. The pain is intermittent. The pain is improved by neck stretches. The pain is worse with prolonged ROM of neck after a long day.     Notably, was sent by Dr Gerri Lins for chronic neck pain a few year ago after having discussion of non surgical therapies for chronic neck pain     Current Medications  Celebrex    Past Medications:  Tylenol      Past Modalities:  TENS:                                                              Yes  Physical Therapy within last 6 months:          Yes  Chiropractor:                                                   Yes  Massage Therapy:                                          Yes  Psychotherapy:                                               No     Patient Complains of:  Uro-fecal Incontinence:                                   No  Weight Gain/Loss:                                          No  Fever/Chills:                                                    No  Weakness:  No    Past Injections  08/29/23 Bilateral C5-C7 MBB local only: minimal relief  05/28/23: CESI C6-C7- minimal relief        01/03/2021   Injection Information   Procedure Date 11/30/2020   Procedure ESI   ESI Lumbar   Lumbar L5 - S1   Effect of Injection (%): 40            The following portions of the patient's history were reviewed and updated as appropriate: allergies, current medications, past family history, past medical history, past social history, past surgical history and problem  list.    Histories  He has a past medical history of Hypertension and Osteoarthritis.    He has no past surgical history on file.    His family history is not on file.    He reports that he has never smoked. He has never used smokeless tobacco.   Objective   Allergies  Atorvastatin    Medications  Outpatient Encounter Medications as of 09/17/2023   Medication Sig Dispense Refill    allopurinoL (ZYLOPRIM) 300 MG tablet Take 1 tablet (300 mg total) by mouth daily.      colchicine 0.6 mg tablet Take 1 tablet (0.6 mg total) by mouth daily.      famotidine (PEPCID) 40 MG tablet Take 1 tablet (40 mg total) by mouth 2 times a day.      fexofenadine-pseudoephedrine (ALLEGRA-D 24) 180-240 mg per 24 hr tablet Take 1 tablet by mouth daily.      lisinopriL (PRINIVIL) 20 MG tablet Take 1 tablet (20 mg total) by mouth daily.      metoprolol succinate (TOPROL-XL) 25 MG 24 hr tablet Take 1 tablet (25 mg total) by mouth daily.       No facility-administered encounter medications on file as of 09/17/2023.        Review of Systems   Musculoskeletal:  Positive for neck pain.   All other systems reviewed and are negative.      Vitals  Blood pressure (!) 139/94, pulse 54, temperature 97.3 F (36.3 C), resp. rate 16, height 5' 1.2 (1.554 m), weight (!) 240 lb (108.9 kg), SpO2 94%.    Physical Exam  Vitals and nursing note reviewed.   Constitutional:       Appearance: Normal appearance.          Comments: Taut muscle bands noted in cervical paraspinal muscles and trapezius bilaterally   Musculoskeletal:         General: Tenderness present.   Skin:     General: Skin is warm and dry.   Neurological:      Mental Status: He is alert.   Psychiatric:         Mood and Affect: Mood normal.         Behavior: Behavior normal.        Ortho Exam    Activities of Daily Living:  1. Activity Level - patient indicates as worse and performs independently.  2. Sleep Patterns - patient indicates as worse and performs independently.                                  - indicates an average of 8 hours of sleep per night.  3. Motivation - patient indicates as worse and performs independently.  4. Functional Level - patient indicates as worse and performs independently.           (  a) Bathing - patient indicates as worse and performs independently.           (b) Dressing - patient indicates as worse and performs independently.           (c) Transferring from bed/chair - patient indicates as worse and performs independently.           (d) Walking - patient indicates as worse and performs independently.           (e) Eating - patient indicates as worse and performs independently.           (f) Toilet Use - patient indicates as worse and performs independently.           (g) Personal Hygiene - patient indicates as worse and performs independently.  Notes:      Review of Lab Results  No results found for: WBC, RBC, HGB, HCT, MCV, MCH, MCHC, RDW, PLT, MPV, CMP, MG, BUN, INR, PTT    OARRS/eKasper Documentation (since 09/17/2020)         Value Time User    UDS Consisten with Prescriber Expectation --  deferred 09/17/2023  8:52 AM Gladstone Pih, DO    OARRS/eKASPER Status OARRS/eKASPER Report received and reviewed 09/17/2023  8:52 AM Gladstone Pih, DO    OARRS/eKASPER Consistent with Prescriber Expectations Yes 09/17/2023  8:52 AM Gladstone Pih, DO          Last Drug Screen    No lab values to display.        Pain Agreement -- Encounter Level:    Pain Agreement: None found at the encounter level.       Pain Agreement -- Patient Level:     [Media Unavailable] Electronic signature on 06/09/2020  4:06 PM - 1 of 2 e-signatures recorded         No results found for this or any previous visit.   I have completed the required OARRS documentation for this patient on 09/17/2023.  Chapman Fitch, MD    Chronic Assessment Tools:      11/30/2020 01/02/2021 09/15/2023   Chronic Pain Assessment Tools   What number best describes how, during  the past week, pain has interfered with your enjoyment of life? 8 3  5     What number best describes your pain on average in the past week: 8 3  5     What number best describes how, during the past week, pain has interfered with your general activity? 5 1  5     PEG TOTAL SCORE 21 7 15     Family/Home Resposibilities  No disability  Moderate    Recreation  1  6    Social Activity  1  4    Occupation  1  No disability    Occupation  1  0    Sexual Behavior  1  Moderate    Self Care  No disability  Mild    Life-Support Activities  No disability  Mild    PDI TOTAL SCORE  4 24    Family History of Alcohol abuse -  No Yes  Yes    Family History of Illegal drug use -  No No  No    Family History of Prescription drug use -  No No  No    Personal history of Alcohol abuse -  No No  No    Personal history of Illegal drug abuse -  No No  No    Personal history of Prescription drug  abuse -   No No  No    History of preadolescent sexual abuse -  No No  No    Psychological disease - Attention-deficit/hyperactivity disorder, Obsessive-compulsive disorder, bipolar disorder, schizophrenia. No No  No    Psychological disease - Depression No No  No    ORT TOTAL SCORE 0 3 3    ORT RISK CATEGORY Low Low Low    1.  How often do you have mood swings? Never Never  Never    2.  How often do you smoke a cigarette within an hour after you wake up?  Never Never  Never    3.  How often have any of your family members, including parents and grandparents, had a problem with alcohol or drugs? Seldom Never  Seldom    4.  How often have any of your close friends had a problem with alcohol or drugs? Seldom Seldom  Seldom    5.  How often have others suggested that you have a drug or alcohol problem? Never Never  Never    6.  How often have you attended an AA or NA meeting? Never Never  Never    7.  How often have you taken medication other than the way that it was prescribed? Never Never  Never    8.  How often have you been treated for an alcohol or  drug problem? Never Never  Never    9.  How often have your medications been lost or stolen? Never Never  Never    10. How often have others expressed concern over your use of medications? Never Never  Never    11. How often have you felt a craving for medication? Never Never  Never    12. How often have you been asked to give a urine screen for substance abuse? Never Sometimes  Seldom    13. How often have you used illegal drugs (for example, marijuana, cocaine, etc.) in the past five years? Never Never  Never    14. How often, in your lifetime, have you had legal problems or been arrested? Never Never  Never    SOAPP TOTAL SCORE 2 3 3     SOAPP INDICATION Negative Negative Negative        Patient-reported          Investigations Reviewed:   Patient Name: Chris Jefferson   Case ID: [id number]   Patient DOB: September 22, 1956   Referring Physician: Roselind Messier, MD   Exam Date: 09/05/2020   Exam Description: MR Lumbar Spine w/o Contrast     HISTORY:  Right-sided low back pain and right lower extremity radiculopathy.  Symptoms for 6 months.   TECHNICAL FACTORS:  Long- and short-axis fat- and water-weighted images were performed.   COMPARISON:  None.   FINDINGS:  No substantial scoliosis.   No fracture seen.  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  See labeled key images.   Conus terminates at T12-L1.   Findings at individual levels demonstrate:   L1-2 mild-to-moderate facet hypertrophy with no neural compression.   L2-3 1mm retrolisthesis.  Bilobed spondylotic disc displacement and mild-to-moderate facet and ligamentum flavum hypertrophy with mild facet capsulitis.  Minor bilateral foramen stenosis.   L3-4 mild diffuse spondylotic disc displacement.  4mm focal right lateral recess caudally-migrating disc herniation abuts descending right L4 nerve root.  Mild-to-moderate facet and ligamentum flavum hypertrophy.  No substantial  foramen stenosis.   L4-5 diffuse spondylotic disc displacement asymmetric to the left with moderate facet hypertrophy.  Gentle abutment of both exiting L5 nerve roots.   L5-S1 transitional.  No neural compression.   Approximately 12mm T1 and T2 dark focus in the left sacral ala indeterminate although most likely a bone island.  Correlate with PSA in elderly male.   No hydronephrosis.  Small renal cysts.  No aortic aneurysm or retroperitoneal adenopathy.     CONCLUSION:   1. 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  See labeled key images.   2. L3-4 mild diffuse spondylotic disc displacement.  4mm focal right lateral recess disc caudally-migrating disc herniation abuts descending right L4 nerve root.  Mild-to-moderate facet and ligamentum flavum hypertrophy. No substantial foramen stenosis.   3. L4-5 diffuse spondylotic disc displacement asymmetric to the left with moderate facet hypertrophy.  Gentle abutment of both exiting L5 nerve roots.   4. An approximately 12mm T1 and T2 dark focus in the left sacral ala indeterminate although most likely a bone island. Correlate with PSA in elderly male.   Thank you for the opportunity to provide your interpretation.   Blima Ledger, MD      ASSESSMENT:  Low back pain  Lumbar Radiculopathy  DDD  Chronic pain syndrome  Cervical Spondylosis    Myofascial Pain      PLAN:  1. OARRs reviewed and consistent. UDS deferred.   2. Continue with HEP and remain as active as possible  3. He did well with L5-S1 ESI on 11/30/20 with good improvement. Can consider repeating this in the future.  4. Referral to integrative medicine-massage therapy placed today  5. Patient has tenderness in the bilateral cervical paraspinal muscles and trapezius bilaterally. Discussed TPI. Discussed the possibility of infection, bleeding, nerve damage, headache, increased pain, seizures. Patient understands and agrees  and the procedure was performed today without complications.      Patient presents with new localized pain, occurring in the last 3 months: yes -muscle spasm/taut band/trigger points which twitches upon palpation  Location:  Bilateral Cervical paraspinal muscles, bilateral trapezius  Diagnosis: Myofascial Pain        Patient's pain has been refractory or intolerant of conservative therapies for at least 1 month, such as:   Physical therapy/ HEP: yes  Massage therapy: no  TENS Unit: no  Heat/Ice: yes    Pharmacotherapies, including:   NSAIDs: yes  Muscle Relaxants: no  Neuropathic pain medications: no  Anti-depressants: no  Topicals: no    TPIs are being given as part of an overall management plan, which will include:     3 - number of Injections (can include a range) in 4 muscles     Name of specific medication to be used for this patient:     Medications such as local anesthetics with or without steroids will be used in the injections.     The injections may be repeated 1-3 times, based on benefit achieved from them.      RTC for procedure once approved      Mayford Knife D.O. PGY-5  [place]  Pain Management Fellow     ATTENDING NOTE  Patient has tenderness in the trapezius, cervical splenius and  bilateral. Discussed TPI. Discussed the possibility of infection, bleeding, nerve damage, headache, increased pain, seizures. Patient understands and agrees and the procedure was performed today without  complications.     Orders Placed This Encounter   Procedures    Massage Therapy     Referral Priority:   Routine     Referral Type:   Support Services     Requested Specialty:   [facility] MASSAGE THERAPY     Number of Visits Requested:   1     I have seen and examined the patient with the fellow, I agree with his assessment, reviewed his note and discussed care with him. I agree with his history, physical examination, assessment and plan as noted above.   Sherece Gambrill Devin Going MBBS, MD

## 2023-09-18 NOTE — Telephone Encounter (Signed)
Appt scheduled

## 2023-09-30 NOTE — Progress Notes (Signed)
 Physical Therapy Treatment Note    Visit Number:  9    Encounter diagnosis:      ICD-10-CM    1. Acute pain of left knee  M25.562       2. Stiffness of left knee  M25.662       3. Weakness of left lower extremity  R29.898       4. Difficulty walking due to

## 2023-10-01 ENCOUNTER — Ambulatory Visit: Admit: 2023-10-01 | Discharge: 2023-10-01 | Payer: MEDICARE

## 2023-10-01 DIAGNOSIS — M7918 Myalgia, other site: Secondary | ICD-10-CM

## 2023-10-01 MED ORDER — bupivacaine (PF)(SENSORCAINE) 0.25% injection
0.25 | Freq: Once | INTRAMUSCULAR | Status: AC
Start: 2023-10-01 — End: 2023-10-01
  Administered 2023-10-01: 10:00:00

## 2023-10-01 MED ORDER — triamcinolone acetonide (KENALOG-40) injection 40 mg
40 | Freq: Once | INTRAMUSCULAR | Status: AC
Start: 2023-10-01 — End: 2023-10-01
  Administered 2023-10-01: 10:00:00

## 2023-10-01 NOTE — H&P (Signed)
 H and P reviewed from previous visit and no changes to patient's clinical presentation. Will proceed with procedure as planned.

## 2023-10-01 NOTE — Procedures (Signed)
 Procedure Performed:  Trigger Point Injections     Preoperative Diagnosis:  Myofascial Pain Syndrome (ICD10 - M79.1)    Postoperative Diagnosis:  Myofascial Pain Syndrome (ICD10 - M79.1)    Attending Surgeon:  Chapman Fitch, MD    Assistant Surgeon:

## 2023-10-11 NOTE — ED Provider Notes (Signed)
FINAL IMPRESSION(S)      ICD-10-CM    1. Cold left foot  R20.9           DISPOSITION PLAN    Home    DISCHARGE MEDICATION(S) / CHANGES TO HOME MEDICATIONS    Discharge Medication List as of 10/11/2023  6:22 PM          ===================================================================      CHIEF COMPLAINT  Chief Complaint   Patient presents with    Medical Problem       TRIAGE NOTE:  JULIE ANN SPINNER, RN  10/11/2023  5:43 PM  Signed  Ambulates to triage, using crutches, with c/o of left ankle being cold compared to right ankle.  Was next door at ortho and they told him to come get check vascular checked out.      HPI / RELEVANT ROS  Chris Jefferson is a 67 y.o. male in bed [facility] 08/08 who presents to the ED with left ankle coldness.  Patient apparently was over at orthopedics and provider had wanted the patient to come to get checked out as they felt the foot was colder than the other 1.  Patient states he does not really feel there is any change and was actually being seen for left knee pain.  Patient apparently has history of meniscal tear and they think he has another tear and ordering some imaging and injections.  Patient states he has no history of vascular problems.  Patient denied chest pain, shortness breath, nausea vomiting.  Patient denied numbness or tingling.  Patient states he has never had any problems with DVTs or occlusions.  He does have a history of high cholesterol and takes Crestor.  Nothing really makes symptoms worse or better.  He did not take any prior arrival.  Past medical history positive for allergic rhinitis, meniscal tear, duodenitis, osteoarthritis, gout, hypertension, rotator cuff tear.  Family history positive for cancer, high blood pressure.  Patient married former smoker and denies drugs or alcohol.  Wife at bedside    PAST MEDICAL HISTORY / FAMILY HISTORY  Past Medical History:   Diagnosis Date    Activity     FC-denies SOB or chest pain with activity    Acute medial  meniscal tear 08/26/2012    Allergic rhinitis 11/15/2014    Diverticulitis     Duodenitis 03/05/2012    Fatty infiltration of liver 03/05/2012    Generalized OA     Gout, arthropathy 10/30/2014    HTN (hypertension)     OSA (obstructive sleep apnea) 02/05/2020    Palpitations 02/03/2015    Prostate cancer (HCC)     Rotator cuff tear 12/12/2012    Status post right hemicolectomy 03/05/2012    Trochanteric bursitis of both hips 01/03/2021      Family History   Problem Relation Age of Onset    Other Diseases Mother     Cancer Mother         Mother    High blood pressure Sister     High blood pressure Brother     Anesth problems Neg Hx     Coronary art dis Neg Hx         Above past medical conditions reviewed and verified by me.    SOCIAL HISTORY  Social History     Socioeconomic History    Marital status: Married     Spouse name: Dois Davenport    Number of children: 2   Occupational History  Occupation: retired   Tobacco Use    Smoking status: Former     Current packs/day: 0.00     Average packs/day: 1 pack/day for 40.0 years (40.0 ttl pk-yrs)     Types: Cigarettes     Start date: 05/01/1967     Quit date: 05/01/2007     Years since quitting: 16.4     Passive exposure: Past    Smokeless tobacco: Former     Quit date: 04/30/1976   Vaping Use    Vaping status: Never Used   Substance and Sexual Activity    Alcohol use: Yes     Comment: 1/ weekly    Drug use: No    Sexual activity: Not Currently     Partners: Female   Other Topics Concern    Daily Caffeine Intake ? Yes    Do you exercise regularly ? Yes   Social History Narrative    Lives with wife and is provider      Social Drivers of Health     Food Insecurity: No Transportation Needs (08/29/2023)    Received from [place], [place], [place]    Yearly Questionnaire     Do you need any assistance with obtaining housing, meals, medication, transportation or medical equipment?: No   Transportation Needs: No Transportation Needs (08/29/2023)    Received from [facility], [place], [place]    Yearly Questionnaire     Do you need any assistance with obtaining housing, meals, medication, transportation or medical equipment?: No   Housing Stability: No Transportation Needs (08/29/2023)    Received from [place], [place], [place]    Yearly Questionnaire     Do you need any assistance with obtaining housing, meals, medication, transportation or medical equipment?: No        Above social elements reviewed and verified by me.    SURGICAL HISTORY  Past Surgical History:   Procedure Laterality Date    cataracts      COLON SURGERY      right hemicolectomy    KNEE ARTHROSCOPY Left 10-17-12    PROSTATE SURGERY      ROTATOR CUFF REPAIR          CURRENT MEDICATIONS  Outpatient Medications Marked as Taking for the 10/11/23 encounter [facility] Encounter)   Medication Sig Dispense Refill    ALLEGRA-D 24 HOUR 180-240 mg per 24 hr tablet TAKE 1 TABLET BY MOUTH EVERY MORNING BEFORE BREAKFAST 30 tablet 11    allopurinoL (ZYLOPRIM) 300 mg tablet TAKE 1 TABLET(300 MG) BY MOUTH DAILY 90 tablet 3    celecoxib (CELEBREX) 200 mg capsule TAKE 1 CAPSULE BY MOUTH EVERY DAY 90 capsule 3    colchicine 0.6 mg tablet TAKE 1 TABLET(0.6 MG) BY MOUTH TWICE DAILY 60 tablet 11    CYANOCOBALAMIN/FOLIC ACID (VITAMIN B12-FOLIC ACID) 500-400 mcg Tab Take  by mouth.      famotidine (PEPCID) 40 mg tablet Take 1 tablet (40 mg total) by mouth daily      fluticasone propionate (FLONASE) 50 mcg/actuation nasal spray SHAKE LIQUID AND USE 2 SPRAYS IN EACH NOSTRIL DAILY 16 g 11    lisinopriL (PRINIVIL) 20 mg tablet TAKE 1 TABLET(20 MG) BY MOUTH DAILY 90 tablet 3    MULTIVITAMIN W-MINERALS/LUTEIN (CENTRUM SILVER PO) Take  by mouth.      rosuvastatin (CRESTOR) 5 mg tablet Patient is currently taking 4 x's weekly per Cardiology.      VITAMIN B COMPLEX NO.12-NIACIN PO Take  by mouth.          ALLERGIES  Not on File    PERTINENT PHYSICAL EXAM    VITAL SIGNS:   ED Triage Vitals [10/11/23 1744]   BP (!)  150/95   Temp 98.2 F (36.8 C)   Pulse 66   Resp 20   SpO2 98 %   Weight 234 lb (106.1 kg)   Glasgow Coma Scale Score 15   BMI (Calculated) 33.6     Alert and oriented X4, mild distress  HEENT no erytehma  Neck no JVD  Lungs CTA  Heart regular rate and rhythm  Abdoment soft nontender   Extremities no edema  Neuro grossly intact  Vascular patient has slight decrease in pulses on the right but was present.  There was slight temperature change on the right versus the left.    MEDICAL DECISION MAKING    ?  Summary / source of outside record review, if applicable: N/A    ?  Differential Diagnosis / Diagnostic Considerations: PVD, PAD, DVT, venous occlusion, arterial occlusion, abscess, cellulitis.  These were sent to ruled out history, physical, exam, vitals, recheck.    ?  Comorbidities impacting presentation- Stability/Severity include: Meniscal tear, diverticulitis, osteoarthritis, gout, hypertension.  Stability stable.  Severe mild to moderate.         Secondary Trauma Exam: N/A    ?  Social determinants of health impacting this presentation, if applicable: Patient sent over by Ortho    ?  Decision regarding hospitalization or escalation of care, if applicable: N/A    ?  Discussion(s) with other providers about care, if applicable: N/A    ED COURSE  Repeat Vitals:  BP: 139/95 (12/06 1830)  Temp: 98.2 F (36.8 C) (12/06 1744)  Pulse: 66 (12/06 1744)  Resp: 20 (12/06 1744)  SpO2: 98 % (12/06 1744)  FiO2 (%): --  O2 Flow Rate (L/min): --  Cardiac (WDL): --  Cardiac Rhythm: --  Medications - No data to display     Patient presents with complaint of possible cold foot.  Patient apparently was over orthopedics and they felt his foot was colder than the other 1 so they sent him in for evaluation.  He states he has no symptoms in his foot was actually been seen for his knee which they feel he has a new meniscal tear.  He states he never had any issues with his foot and never had a DVT or any vascular problems.   States he does not feel like it is colder and denies any numbness or tingling.  He states he has no pain in the foot.  Denied trauma or injury.  Denied fevers or chills.  On exam patient had some slight decrease in pulse in the left foot versus the right and slight temperature change right versus left but patient had good pulses otherwise and was neurovascularly intact.  Patient will follow-up with Dr. Milagros Loll and was consulted and will follow-up outpatient.  Patient is asymptomatic therefore told him to come back if he has increased pain, numbness or tingling in the foot.  Patient remained stable in ER.      Electronically signed by: Gaspar Skeeters, DO, 10/11/2023        Gaspar Skeeters, DO  10/11/23 1857

## 2023-10-29 ENCOUNTER — Ambulatory Visit: Admit: 2023-10-29 | Discharge: 2023-10-29 | Payer: MEDICARE

## 2023-10-29 DIAGNOSIS — M47812 Spondylosis without myelopathy or radiculopathy, cervical region: Secondary | ICD-10-CM

## 2023-10-29 NOTE — Progress Notes (Signed)
Chief Complaint   Patient presents with    Neck Pain       Subjective History of Present Illness  Neck Pain       Thurston Sheets is a 67 y.o. male who is here for follow up of chronic neck pain, s/p TPI in neck on 10/01/23 with minimal relief.    Scheduled for Left knee scope with partial meniscectomy, chondroplasty on 11/08/23.    Neck pain is 3/10 on VAS, at maximum is 7/10. Pain is aching and dull in nature. Pain is not referred. The pain is intermittent. The pain is improved by home neck stretches. The pain is worse with with prolonged  ROM.     Notably, was sent by Dr Gerri Lins for chronic neck pain a few year ago after having discussion of non surgical therapies for chronic neck pain. Not interested additional medications at this time     Current Medications  Celebrex     Past Medications:  Tylenol      Past Modalities:  TENS:                                                              Yes  Physical Therapy within last 6 months:          Yes  Chiropractor:                                                   Yes  Massage Therapy:                                          Yes  Psychotherapy:                                               No     Patient Complains of:  Uro-fecal Incontinence:                                   No  Weight Gain/Loss:                                          No  Fever/Chills:                                                    No  Weakness:  No     Past Injections  08/29/23 Bilateral C5-C7 MBB local only: minimal relief  05/28/23: CESI C6-C7- minimal relief      The following portions of the patient's history were reviewed and updated as appropriate: allergies, current medications, past family history, past medical history, past social history, past surgical history and problem list.    Histories  He has a past medical history of Cancer (CMS-HCC) (2010), GERD (gastroesophageal reflux disease) (2012), Glaucoma (2011), Hypertension, Liver  disease (2014), and Osteoarthritis.    He has a past surgical history that includes Abdominal surgery (1999); Eye surgery (2018); Hernia repair (1966); and Colon surgery (1999).    His family history is not on file.    He reports that he has an unknown smoking status. He has never used smokeless tobacco. He reports current alcohol use of about 2.0 standard drinks of alcohol per week. He reports that he does not use drugs.   Objective   Allergies  Atorvastatin    Medications  Outpatient Encounter Medications as of 10/29/2023   Medication Sig Dispense Refill    allopurinoL (ZYLOPRIM) 300 MG tablet Take 1 tablet (300 mg total) by mouth daily.      colchicine 0.6 mg tablet Take 1 tablet (0.6 mg total) by mouth daily.      famotidine (PEPCID) 40 MG tablet Take 1 tablet (40 mg total) by mouth 2 times a day.      fexofenadine-pseudoephedrine (ALLEGRA-D 24) 180-240 mg per 24 hr tablet Take 1 tablet by mouth daily.      lisinopriL (PRINIVIL) 20 MG tablet Take 1 tablet (20 mg total) by mouth daily.      meloxicam (MOBIC) 15 MG tablet Take 1 tablet (15 mg total) by mouth daily.      metoprolol succinate (TOPROL-XL) 25 MG 24 hr tablet Take 1 tablet (25 mg total) by mouth daily.      [EXPIRED] bupivacaine (PF)(SENSORCAINE) 0.25% injection       [EXPIRED] triamcinolone acetonide (KENALOG-40) injection 40 mg        No facility-administered encounter medications on file as of 10/29/2023.        Review of Systems   Musculoskeletal:  Positive for neck pain.       Vitals  Blood pressure 142/83, pulse 56, height 5' 10 (1.778 m), weight (!) 240 lb (108.9 kg), SpO2 93%.    Physical Exam  Vitals and nursing note reviewed.   Constitutional:       Appearance: Normal appearance.       Musculoskeletal:         General: Tenderness present.   Skin:     General: Skin is warm and dry.   Neurological:      Mental Status: He is alert.        Ortho Exam    Activities of Daily Living:  1. Activity Level - patient indicates as the same and performs  independently.  2. Sleep Patterns - patient indicates as worse and performs independently.                                 - indicates an average of 8 hours of sleep per night.  3. Motivation - patient indicates as the same and performs independently.  4. Functional Level - patient indicates as the same and performs independently.           (a) Bathing - patient indicates as the  same and performs independently.           (b) Dressing - patient indicates as the same and performs independently.           (c) Transferring from bed/chair - patient indicates as the same and performs independently.           (d) Walking - patient indicates as the same and performs independently.           (e) Eating - patient indicates as the same and performs independently.           (f) Toilet Use - patient indicates as the same and performs independently.           (g) Personal Hygiene - patient indicates as the same and performs independently.  Notes:      Review of Lab Results  No results found for: WBC, RBC, HGB, HCT, MCV, MCH, MCHC, RDW, PLT, MPV, CMP, MG, BUN, INR, PTT    OARRS/eKasper Documentation (since 10/29/2020)         Value Time User    UDS Consisten with Prescriber Expectation --  deferred 09/17/2023  8:52 AM Gladstone Pih, DO    OARRS/eKASPER Status OARRS/eKASPER Report received and reviewed 10/29/2023  8:54 AM Gladstone Pih, DO    OARRS/eKASPER Consistent with Prescriber Expectations Yes 10/29/2023  8:54 AM Gladstone Pih, DO          Last Drug Screen    No lab values to display.        Pain Agreement -- Encounter Level:    Pain Agreement: None found at the encounter level.       Pain Agreement -- Patient Level:     [Media Unavailable] Electronic signature on 06/09/2020  4:06 PM - 1 of 2 e-signatures recorded         No results found for this or any previous visit.   I have completed the required OARRS documentation for this patient on 10/29/2023.  Chapman Fitch,  MD    Chronic Assessment Tools:      11/30/2020 01/02/2021 09/15/2023   Chronic Pain Assessment Tools   What number best describes how, during the past week, pain has interfered with your enjoyment of life? 8 3  5     What number best describes your pain on average in the past week: 8 3  5     What number best describes how, during the past week, pain has interfered with your general activity? 5 1  5     PEG TOTAL SCORE 21 7 15     Family/Home Resposibilities  No disability  Moderate    Recreation  1  6    Social Activity  1  4    Occupation  1  No disability    Occupation  1  0    Sexual Behavior  1  Moderate    Self Care  No disability  Mild    Life-Support Activities  No disability  Mild    PDI TOTAL SCORE  4 24    Family History of Alcohol abuse -  No Yes  Yes    Family History of Illegal drug use -  No No  No    Family History of Prescription drug use -  No No  No    Personal history of Alcohol abuse -  No No  No    Personal history of Illegal drug abuse -  No No  No    Personal history of Prescription drug  abuse -   No No  No    History of preadolescent sexual abuse -  No No  No    Psychological disease - Attention-deficit/hyperactivity disorder, Obsessive-compulsive disorder, bipolar disorder, schizophrenia. No No  No    Psychological disease - Depression No No  No    ORT TOTAL SCORE 0 3 3    ORT RISK CATEGORY Low Low Low    1.  How often do you have mood swings? Never Never  Never    2.  How often do you smoke a cigarette within an hour after you wake up?  Never Never  Never    3.  How often have any of your family members, including parents and grandparents, had a problem with alcohol or drugs? Seldom Never  Seldom    4.  How often have any of your close friends had a problem with alcohol or drugs? Seldom Seldom  Seldom    5.  How often have others suggested that you have a drug or alcohol problem? Never Never  Never    6.  How often have you attended an AA or NA meeting? Never Never  Never    7.  How often have  you taken medication other than the way that it was prescribed? Never Never  Never    8.  How often have you been treated for an alcohol or drug problem? Never Never  Never    9.  How often have your medications been lost or stolen? Never Never  Never    10. How often have others expressed concern over your use of medications? Never Never  Never    11. How often have you felt a craving for medication? Never Never  Never    12. How often have you been asked to give a urine screen for substance abuse? Never Sometimes  Seldom    13. How often have you used illegal drugs (for example, marijuana, cocaine, etc.) in the past five years? Never Never  Never    14. How often, in your lifetime, have you had legal problems or been arrested? Never Never  Never    SOAPP TOTAL SCORE 2 3 3     SOAPP INDICATION Negative Negative Negative        Patient-reported          Investigations Reviewed:   Patient Name: Khang Holeman   Case ID: [id number]   Patient DOB: 1956/01/28   Referring Physician: Roselind Messier, MD   Exam Date: 09/05/2020   Exam Description: MR Lumbar Spine w/o Contrast     HISTORY:  Right-sided low back pain and right lower extremity radiculopathy.  Symptoms for 6 months.   TECHNICAL FACTORS:  Long- and short-axis fat- and water-weighted images were performed.   COMPARISON:  None.   FINDINGS:  No substantial scoliosis.   No fracture seen.  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  See labeled key images.   Conus terminates at T12-L1.   Findings at individual levels demonstrate:   L1-2 mild-to-moderate facet hypertrophy with no neural compression.   L2-3 1mm retrolisthesis.  Bilobed spondylotic disc displacement and mild-to-moderate facet and ligamentum flavum hypertrophy with mild facet capsulitis.  Minor bilateral foramen stenosis.   L3-4 mild diffuse spondylotic disc displacement.  4mm focal right lateral recess  caudally-migrating disc herniation abuts descending right L4 nerve root.  Mild-to-moderate facet and ligamentum flavum hypertrophy.  No substantial foramen stenosis.   L4-5 diffuse spondylotic disc displacement asymmetric to the left with moderate facet hypertrophy.  Gentle abutment of both exiting L5 nerve roots.   L5-S1 transitional.  No neural compression.   Approximately 12mm T1 and T2 dark focus in the left sacral ala indeterminate although most likely a bone island.  Correlate with PSA in elderly male.   No hydronephrosis.  Small renal cysts.  No aortic aneurysm or retroperitoneal adenopathy.     CONCLUSION:   1. 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  See labeled key images.   2. L3-4 mild diffuse spondylotic disc displacement.  4mm focal right lateral recess disc caudally-migrating disc herniation abuts descending right L4 nerve root.  Mild-to-moderate facet and ligamentum flavum hypertrophy. No substantial foramen stenosis.   3. L4-5 diffuse spondylotic disc displacement asymmetric to the left with moderate facet hypertrophy.  Gentle abutment of both exiting L5 nerve roots.   4. An approximately 12mm T1 and T2 dark focus in the left sacral ala indeterminate although most likely a bone island. Correlate with PSA in elderly male.   Thank you for the opportunity to provide your interpretation.   Blima Ledger, MD      Narrative  EXAM: MRI BRAIN WO CONTRAST   EXAM: MRI CERVICAL SPINE WO CONTRAST     INDICATION: right temporal headaches.     TECHNIQUE:  MRI brain and cervical spine performed including multiplanar T1 and T2 weighted sequences.     COMPARISON: None available.     FINDINGS:     The diagnostic quality of the examination is adequate.     Brain parenchyma: No diffusion restriction. Mild scattered white matter signal alterations. No hemorrhagic staining. Normal pituitary gland, optic chiasm, and cerebellar  tonsils.     Ventricles and extraaxial spaces: Normal ventricular size and position. No extra-axial fluid collection.     Marrow signal of calvarium and skull base: Normal.     Orbits, paranasal sinuses, and mastoid regions: No acute orbital abnormality. Scattered paranasal sinus mucous retention cysts. Aerated mastoid air cells.     Vascular structures: Normal arterial and venous flow voids.     Cervical alignment: Normal.     Osseous structures: Disc desiccation and moderate disc height loss with mild to moderate osteophytes. Normal marrow signal.     Spinal cord: Normal signal and morphology.     Individual disc space levels:     C2-3: Right-sided facet arthropathy causes mild neuroforaminal stenosis. No significant thecal sac or left neuroforaminal narrowing.     C3-4: Disc bulge is noncompressive. Asymmetric right facet and uncinate hypertrophy is seen without significant neuroforaminal stenosis. There is a left-sided facet joint effusion.     C4-5: Disc bulge is noncompressive. Joint hypertrophic change contributes to moderate bilateral neuroforaminal stenosis.     C5-6: Disc bulge, asymmetric on the left, and left-sided uncinate hypertrophy causes mild asymmetric left thecal sac narrowing and moderate to severe left neuroforaminal stenosis     C6-7: Disc bulge/disc osteophyte complex and joint hypertrophic changes contribute to at least moderate left and mild right neuroforaminal stenosis.     C7-T1: Mild noncompressive diffuse disc bulge with patent neural foramina.     T1-2: Mild noncompressive diffuse disc bulge on limited sagittal views.     T2-3: Mild noncompressive diffuse disc bulge on limited sagittal views.     Extraspinal structures: No neck mass or adenopathy.  Impression  IMPRESSION:     Brain   1. Mild nonspecific white matter hyperintensity, likely related to small vessel disease.   2.  No intracranial mass or hemorrhage.     Cervical spine   1.  Moderate multilevel cervical degenerative disc  disease most significant at C5-6 where left-sided uncinate hypertrophy contributes to at least least moderate to severe left neuroforaminal stenosis.   2.  Normal alignment.   3.  Normal cervical cord signal.       Approved by Mardene Celeste, DO on 02/21/2023 11:25 AM EDT     I have personally reviewed the images and I agree with this report.     Report Verified by: Burt Ek, MD at 02/21/2023 11:35 AM EDT      ASSESSMENT:  Low back pain  Lumbar Radiculopathy  DDD  Chronic pain syndrome  Cervical Spondylosis    Myofascial Pain      PLAN:  1. OARRs reviewed and consistent. UDS deferred.   2. Continue with HEP and remain as active as possible  3. He did well with L5-S1 ESI on 11/30/20 with good improvement. Can consider repeating this in the future  4. Discussed with patient that as he has failed TPI, Cervical MBB, and CESI, a new visit with Dr Gerri Lins at this time for chronic neck pain, and if any surgeries are now indicated.  His pain is localized in the neck and is coming from facet arthropathy, can consider a repeat MBB.     RTC in 8 weeks      Mayford Knife D.O. PGY-5  [facility]  Pain Management Fellow     ATTENDING NOTE  I have seen and examined the patient with the fellow, I agree with his assessment, reviewed his note and discussed care with him. I agree with his history, physical examination, assessment and plan as noted above.   Teige Rountree Devin Going MBBS, MD

## 2024-02-03 IMAGING — MR MRI RIGHT HIP WITHOUT CONTRAST
4 of 6 series · 17 of 48 positions shown · IV contrast (gadolinium)
Comparison: None.

________________________________________________________________________________________________ 
MRI RIGHT HIP WITHOUT CONTRAST, 02/03/2024 [DATE]: 
CLINICAL INDICATION: Multiple Myeloma Not Having Achieved Remission. History of 
prostate carcinoma and skin cancer.
TECHNIQUE: Multiplanar, multiecho position MR images of the pelvis and right hip 
were performed without intravenous gadolinium enhancement. Small field-of-view 
imaging was performed of the hip.

[Series 801: survey · axial · 15.0mm · 1.76mm/px · z∈[+67,+324]mm · 4 of 14 slices shown]
[im 1/14]
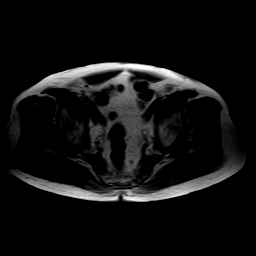
[im 5/14]
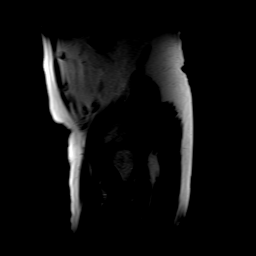
[im 9/14]
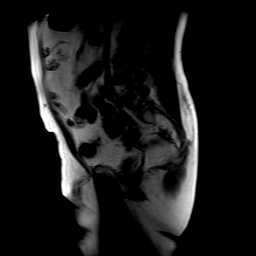
[im 14/14]
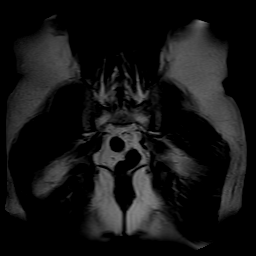

[Series 901: stir_cor-pelvis · coronal · 5.0mm · 0.59mm/px · 7 of 35 slices shown]
[im 1/35]
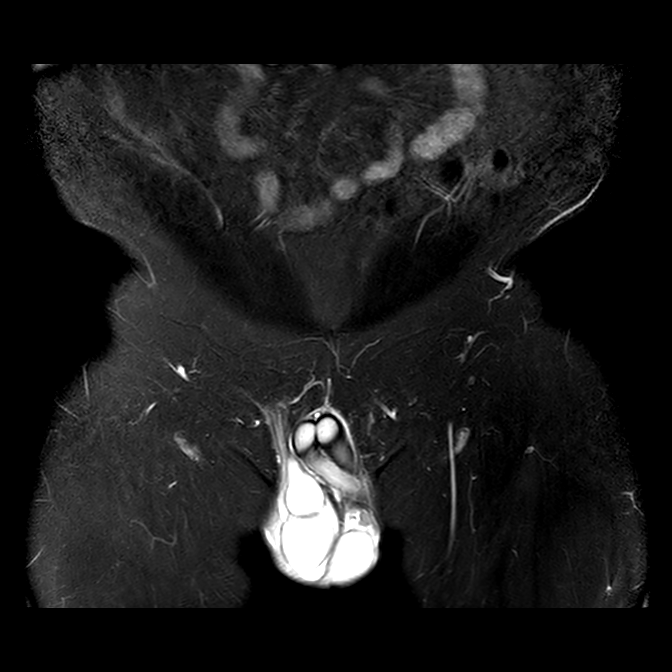
[im 5/35]
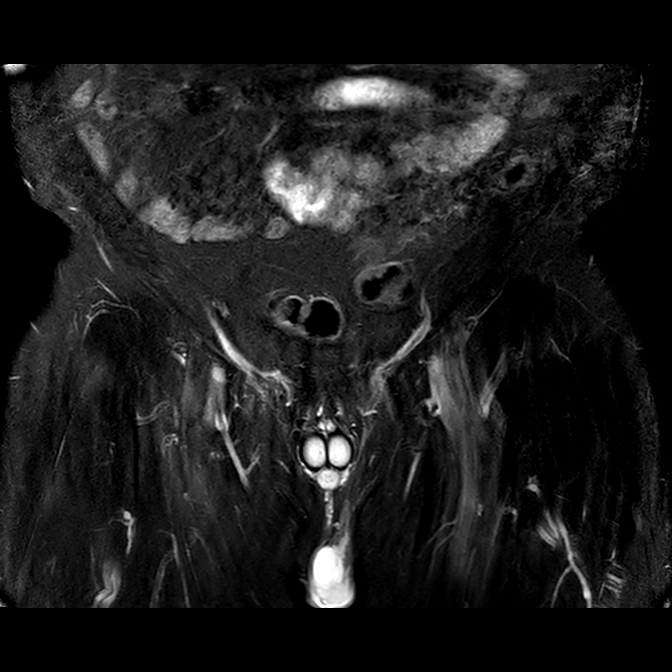
[im 9/35]
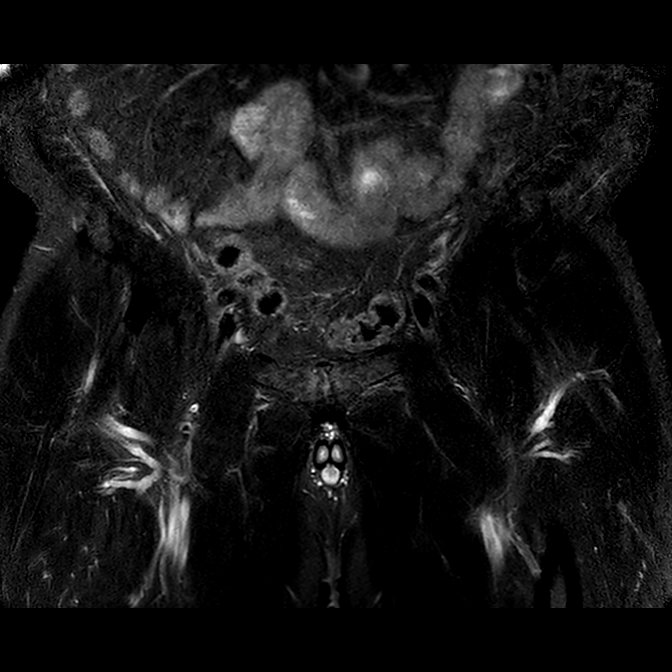
[im 13/35]
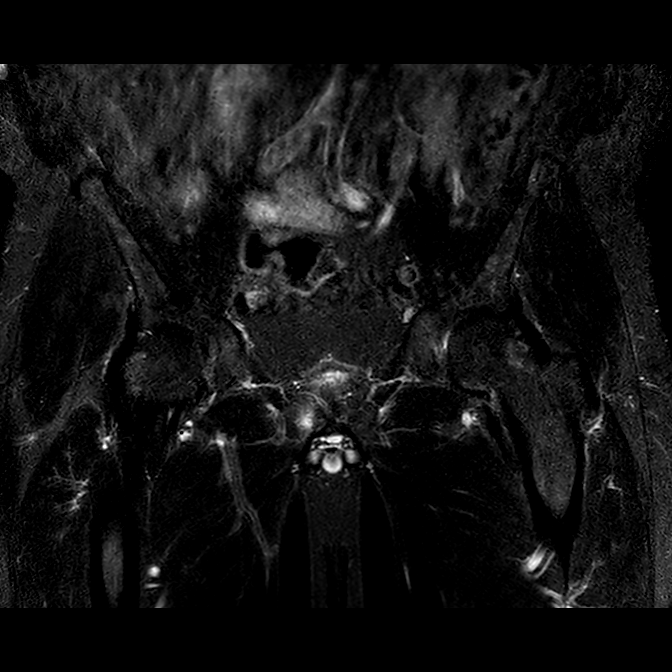
[im 18/35]
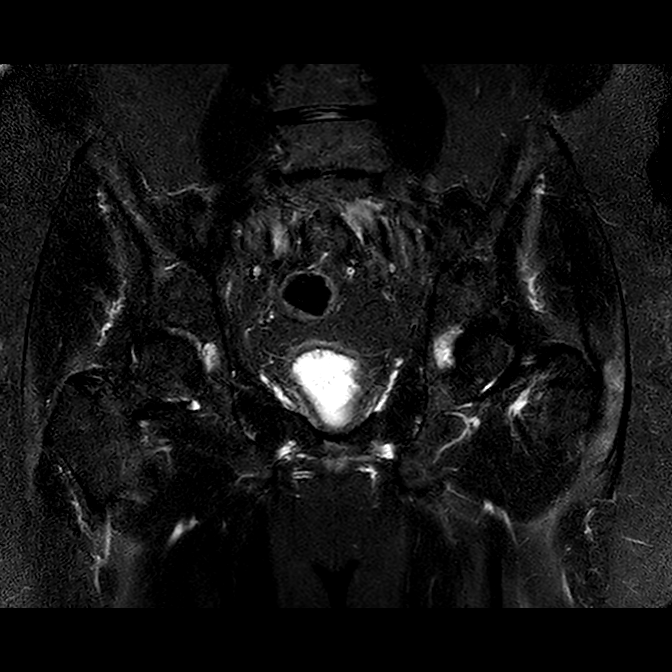
[im 22/35]
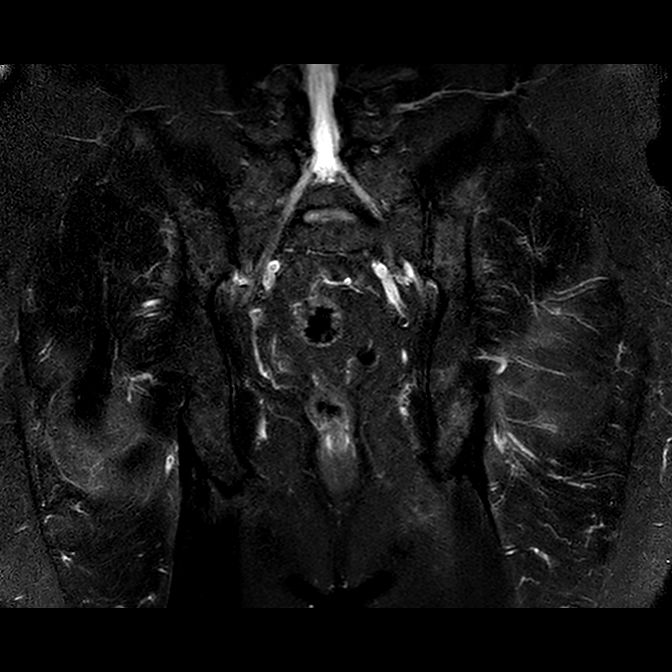
[im 30/35]
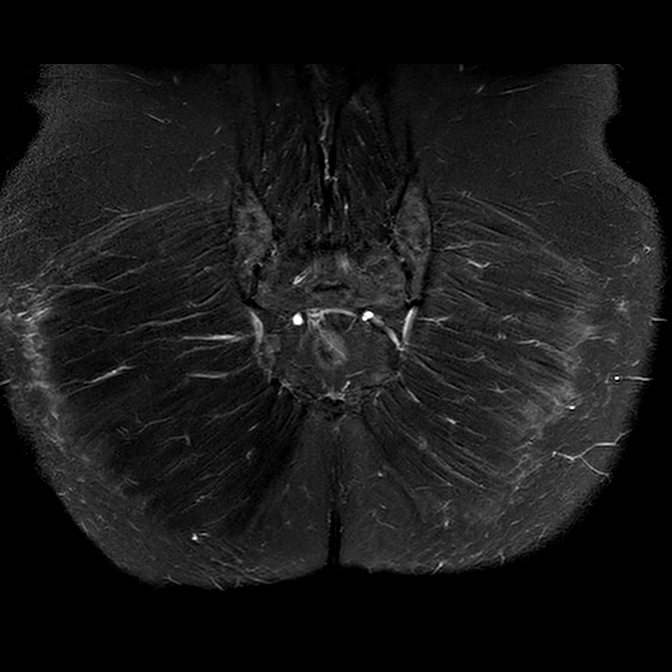

[Series 1001: t1_(person_name) · axial · 5.0mm · 0.61mm/px · z∈[-50,+141]mm · 3 of 45 slices shown]
[im 9/45]
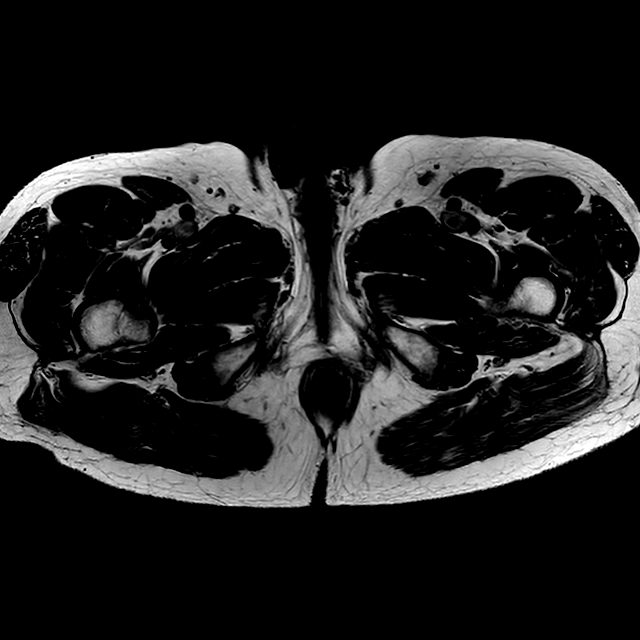
[im 25/45]
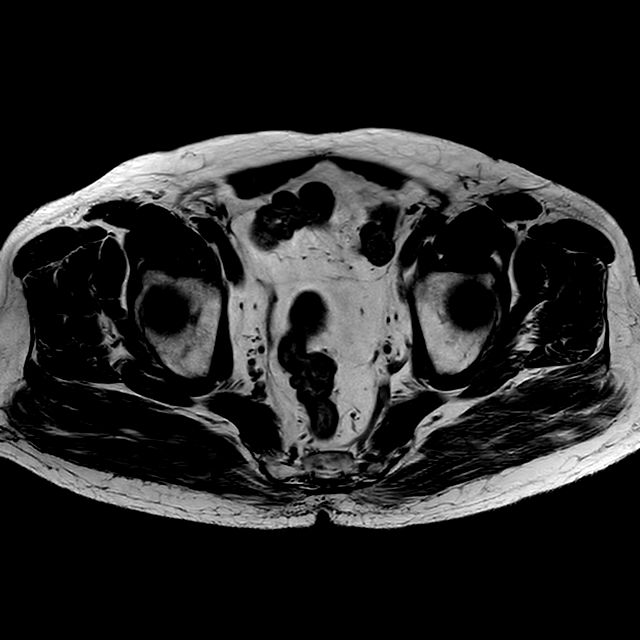
[im 41/45]
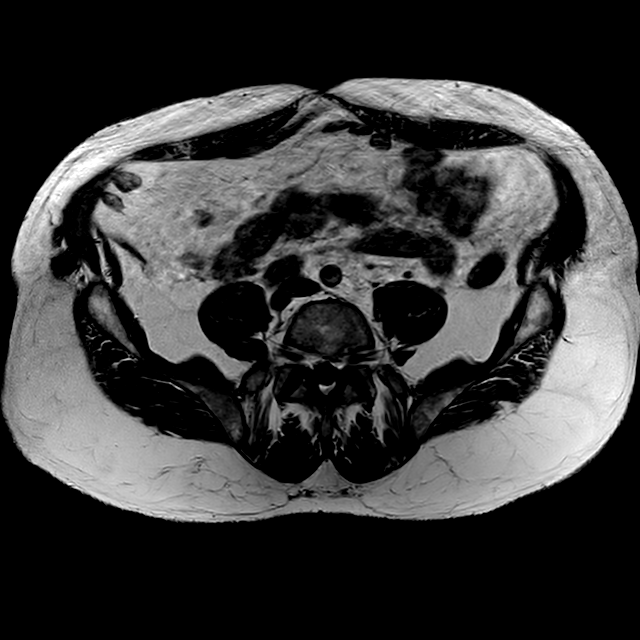

[Series 1101: pd_fs_sag_rt · sagittal · right · 4.0mm · 0.55mm/px · 3 of 31 slices shown]
[im 5/31]
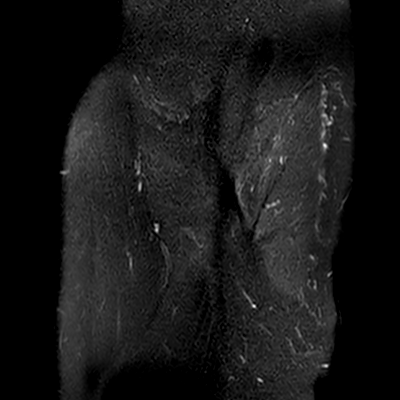
[im 18/31]
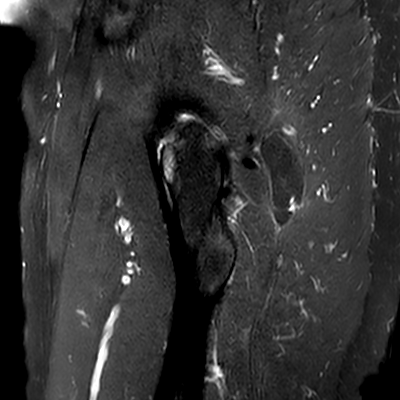
[im 26/31]
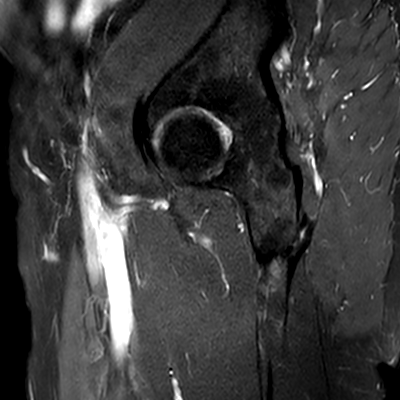

[17 of 48 positions shown; findings below may reference images not displayed]

FINDINGS: HIPS: Moderate right hip degenerative change with partial-thickness and 
full-thickness chondromalacia, degenerative labral tears, small subcortical 
cysts, anterior femoral neck herniation pit and 2.0 cm segment of adjacent 
femoral neck bone marrow edema. Mild left hip degenerative change, anterior 
femoral neck herniation pit and 1.7 cm segment of adjacent femoral neck bone 
marrow edema. No paralabral cyst. No hip joint effusion. Both femoral heads 
maintain a spherical configuration without evidence of avascular necrosis or 
subarticular collapse. No abnormal morphology of the proximal femurs or 
acetabulum to predispose to impingement. 
PELVIC BONES: Right pubic body bone marrow edema. No fracture or marrow 
replacing lesion.  
SI JOINTS: Mild degenerative change. 
PUBIC SYMPHYSIS: Degenerative change with right pubic body bone marrow edema and 
0.5 cm subcortical cyst. 
SPINE: Multilevel degenerative change of the spine. 
SOFT TISSUES: Mild tendinosis of the bilateral distal gluteus minimus tendons 
with tendon thickening, intermediate signal and mild peritendinous edema. The 
abductor cuffs are otherwise preserved without high-grade interstitial tear. 
There is trace fluid overlying the greater trochanters without overt 
trochanteric bursitis. The origins of the hamstrings are intact. The rectus 
abdominis-adductor aponeurotic complexes are intact. No mass, free fluid or 
adenopathy. Prostatectomy. The bowel and bladder are unremarkable.
IMPRESSION: 1.  No evidence of myeloma/metastases. 
2.  Moderate right hip degenerative change with, labral tears, anterior femoral 
neck herniation pit and femoral neck bone marrow edema.  
3.  Degenerative change with right pubic body bone marrow edema and 0.5 cm 
subcortical cyst. 
4.  Mild left hip degenerative change, anterior femoral neck herniation pit and 
femoral neck bone marrow edema.   
5.  Degenerative change of the SI joints and spine.  
6.  Prostatectomy.

## 2024-02-03 IMAGING — MR MRI LUMBAR SPINE WITHOUT CONTRAST
5 of 8 series · 11 of 48 positions shown · IV contrast (gadolinium)
Comparison: MRI right hip February 03, 2024.

________________________________________________________________________________________________ 
MRI LUMBAR SPINE WITHOUT CONTRAST, 02/03/2024 [DATE]: 
CLINICAL INDICATION: Right hip pain. Bone lesions visualized within the right 
hip. Evaluate for multiple myeloma. Skin and prostate cancer.
TECHNIQUE: Multiplanar, multiecho position MR images of the lumbar spine were 
performed without intravenous gadolinium enhancement.

[Series 101: survey · axial · 10.0mm · 1.25mm/px · 1 of 10 slices shown]
[im 1/10]
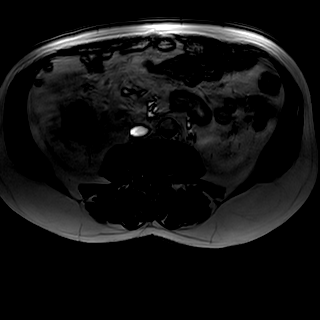

[Series 201: t2w_cor-surv · coronal · 6.0mm · 0.62mm/px · 1 of 10 slices shown]
[im 1/10]
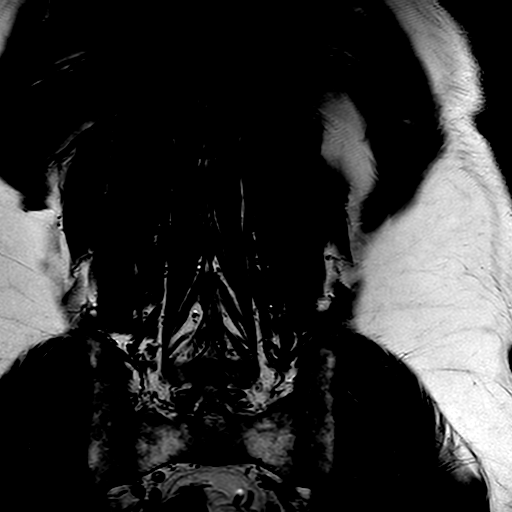

[Series 302: (id)_mdixon_tse · sagittal · 4.0mm · 0.37mm/px · 3 of 19 slices shown]
[im 1/19]
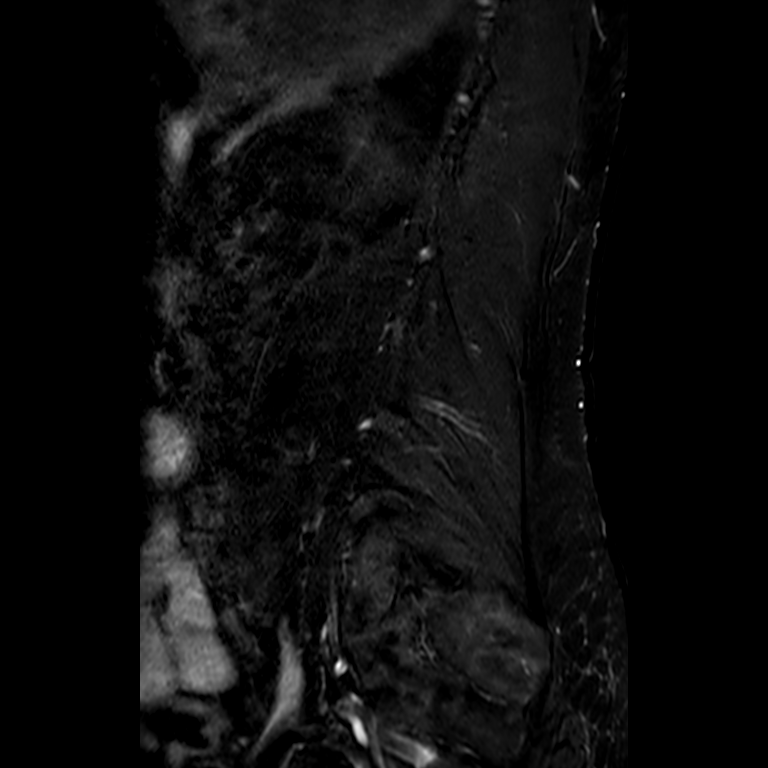
[im 10/19]
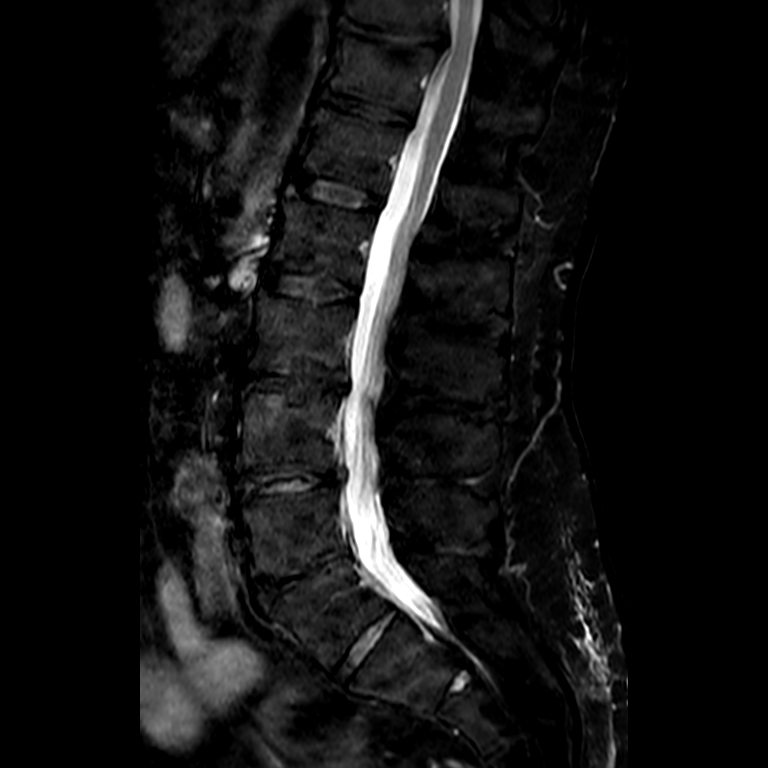
[im 19/19]
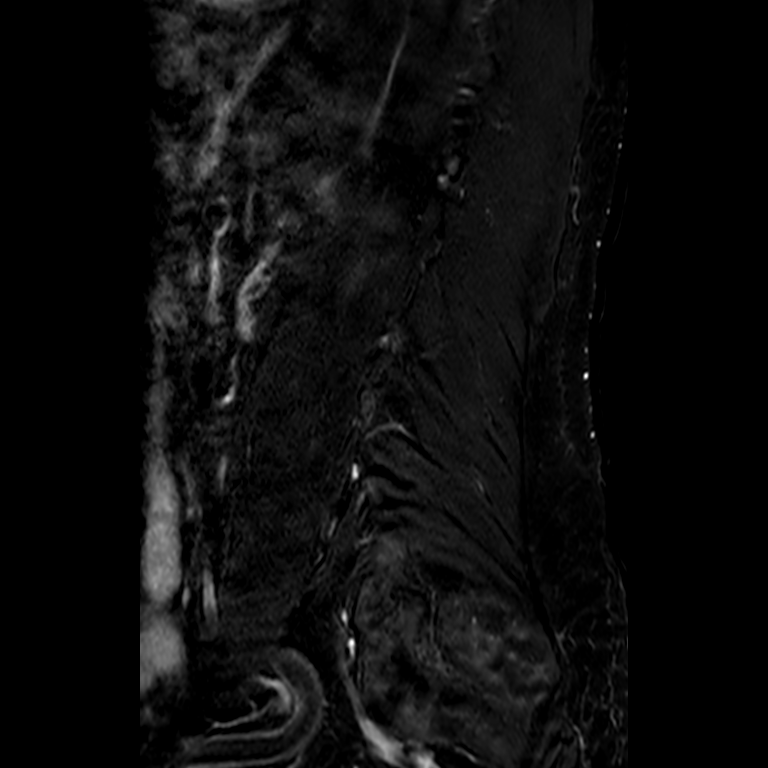

[Series 303: st2w_mdixon_tse · sagittal · 4.0mm · 0.37mm/px · 3 of 19 slices shown]
[im 1/19]
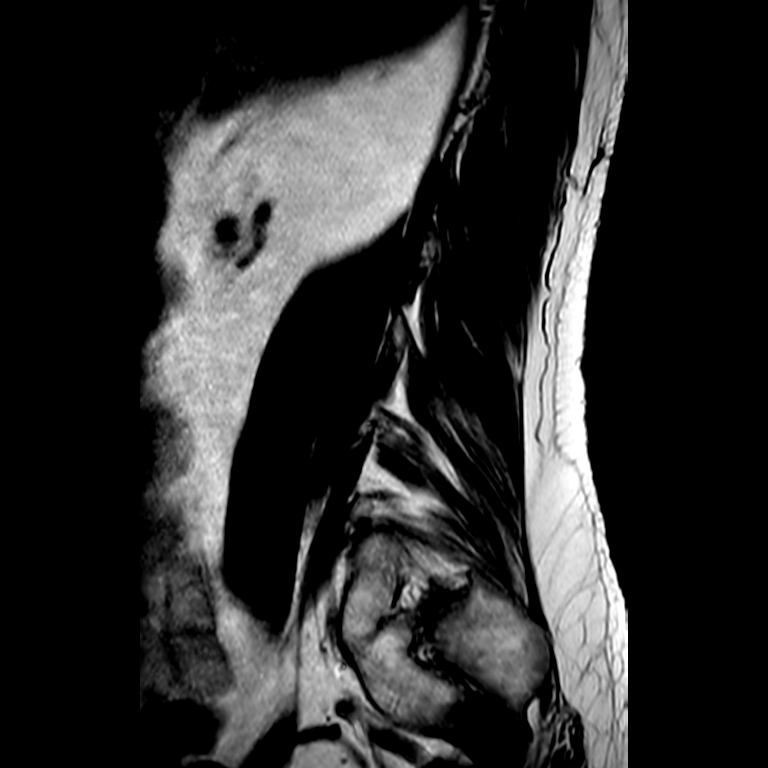
[im 10/19]
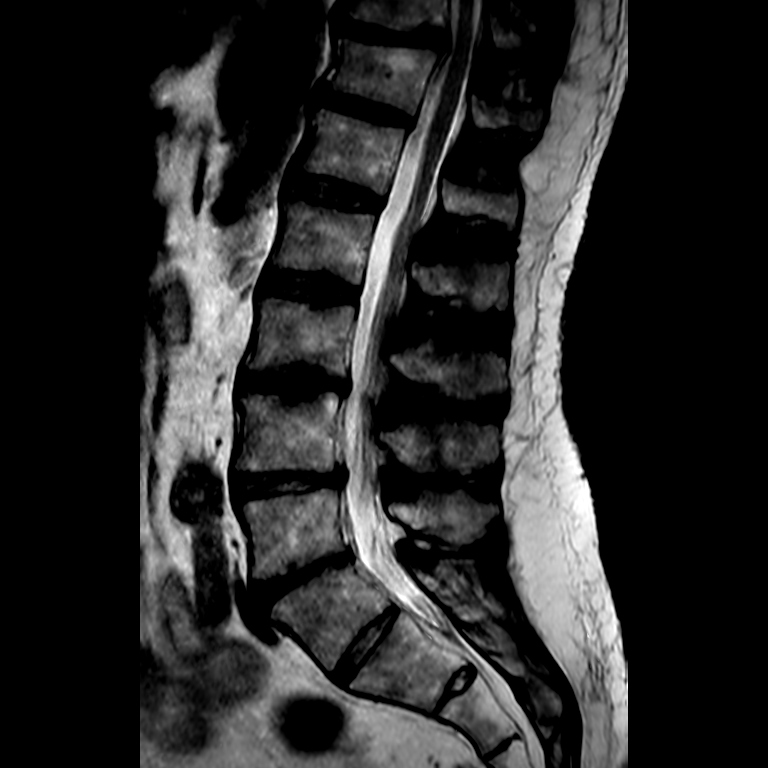
[im 19/19]
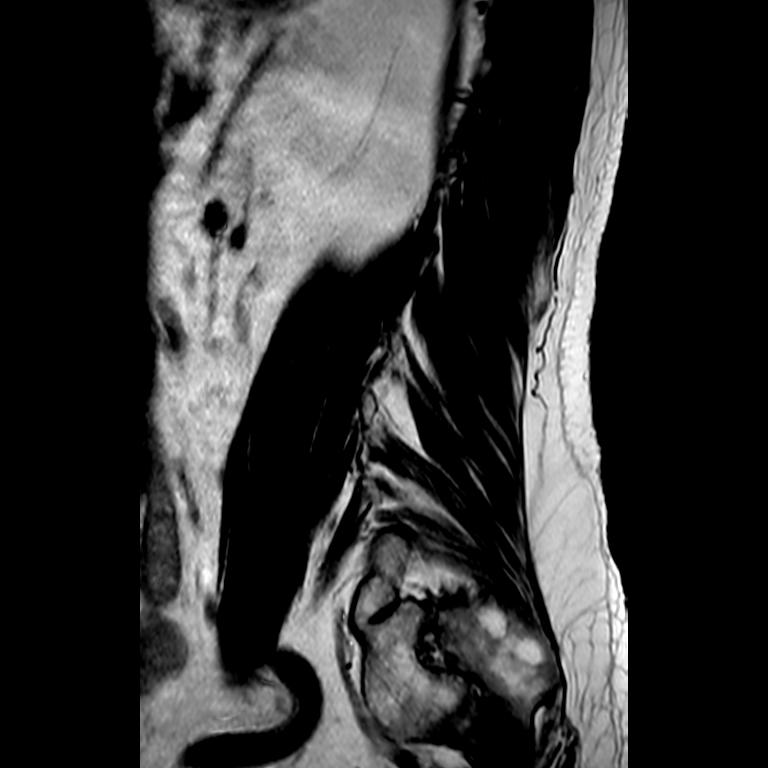

[Series 401: t1_tse_sag · sagittal · 4.0mm · 0.51mm/px · 3 of 19 slices shown]
[im 1/19]
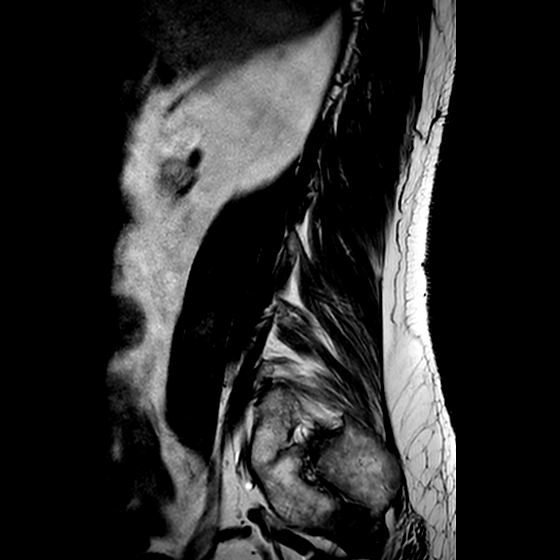
[im 10/19]
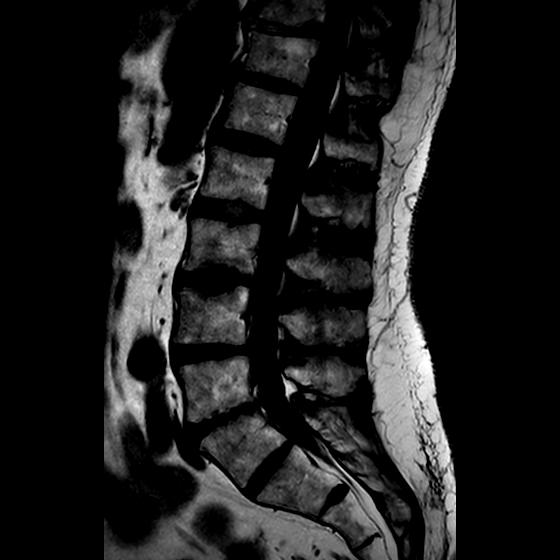
[im 19/19]
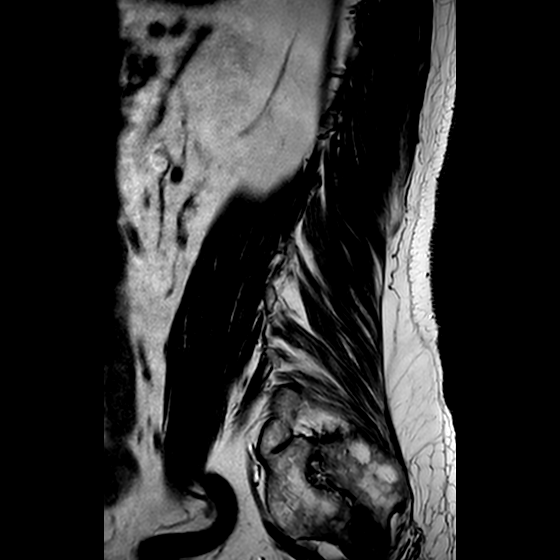

[11 of 48 positions shown; findings below may reference images not displayed]

FINDINGS: -------------------------------------------------------------------------------- 
------ 
GENERAL: 
Transitional anatomy. The L5 level is sacralized. T12 level containing small 
bilateral ribs.     
ALIGNMENT: Normal coronal alignment. Grade 1 retrolisthesis L2 on L3. 
VERTEBRAL BODY HEIGHT: Minimal chronic appearing anterior wedging T11.  
MARROW SIGNAL: No focal suspect signal abnormality. 
CORD SIGNAL: Normal distal spinal cord and cauda equina. Conus medullaris 
terminates at L1. 
ADDITIONAL FINDINGS: Exophytic lesion off the left kidney may represent a cyst 
but is technically indeterminate. Colonic diverticulosis. 
Modic I-II: None. 
Ligamentum Flavum > 2.5 mm: All levels. 
-------------------------------------------------------------------------------- 
------ 
SEGMENTAL: 
T12-L1: Slight loss of disc signal. Otherwise normal. 
L1-L2: Schmorls nodes with slight loss of disc signal, otherwise normal. 
L2-L3: Schmorls nodes with minimal adjacent edema. Loss of disc height and 
signal. Mild left foraminal narrowing. Borderline canal stenosis with mild 
lateral recess and bilaterally. Right foramen patent. Normal facets. 
L3-L4: Schmorls nodes with loss of disc height and signal and mild annular 
bulge. There is mild narrowing of the lateral recesses bilaterally. Canal is 
otherwise patent. Facet arthropathy. 
L4-L5: Loss of disc height and signal with Schmorls nodes and minimal annular 
bulge with patent canal. Right foramen is mildly narrowed. Left foramen is 
moderately narrowed. Facet arthropathy. 
L5-S1: Normal disc height and signal with patent canal and foramina. Facet 
arthropathy. 
-------------------------------------------------------------------------------- 
------
IMPRESSION: Transitional anatomy. Please see above discussion. If intervention is 
entertained, correlate with radiographic guidance. 
Mild lumbar degenerative changes without significant canal stenosis. Variable 
mild lateral recess and foraminal narrowing at multiple levels. 
There is no evidence of a marrow infiltrative process within the visualized 
aspects of the lumbar spine and sacrum. 
Exophytic lesion left lower pole, indeterminate. Follow-up renal ultrasound 
suggested.
